# Patient Record
Sex: Female | Born: 1965 | Race: White | Marital: Single | Smoking: Current every day smoker
Health system: Southern US, Community
[De-identification: ages and names within clinical notes are randomized; demographics above are authoritative.]

## PROBLEM LIST (undated history)

## (undated) DIAGNOSIS — I10 Essential (primary) hypertension: Secondary | ICD-10-CM

## (undated) DIAGNOSIS — E785 Hyperlipidemia, unspecified: Secondary | ICD-10-CM

## (undated) DIAGNOSIS — K219 Gastro-esophageal reflux disease without esophagitis: Secondary | ICD-10-CM

## (undated) DIAGNOSIS — M1990 Unspecified osteoarthritis, unspecified site: Secondary | ICD-10-CM

## (undated) HISTORY — DX: Gastro-esophageal reflux disease without esophagitis: K21.9

## (undated) HISTORY — DX: Essential (primary) hypertension: I10

## (undated) HISTORY — DX: Hyperlipidemia, unspecified: E78.5

## (undated) HISTORY — PX: OTHER SURGICAL HISTORY: SHX169

## (undated) HISTORY — DX: Unspecified osteoarthritis, unspecified site: M19.90

---

## 2006-05-27 HISTORY — PX: ABDOMINAL HYSTERECTOMY: SHX81

## 2018-05-29 DIAGNOSIS — I10 Essential (primary) hypertension: Secondary | ICD-10-CM | POA: Insufficient documentation

## 2019-07-09 ENCOUNTER — Other Ambulatory Visit: Payer: Self-pay | Admitting: Medical

## 2019-07-09 ENCOUNTER — Other Ambulatory Visit: Payer: Self-pay | Admitting: Family Medicine

## 2019-07-09 DIAGNOSIS — M48061 Spinal stenosis, lumbar region without neurogenic claudication: Secondary | ICD-10-CM

## 2019-07-21 ENCOUNTER — Ambulatory Visit
Admission: RE | Admit: 2019-07-21 | Discharge: 2019-07-21 | Disposition: A | Discharge status: Home / Self Care | Payer: Managed Care, Other (non HMO) | Source: Ambulatory Visit | Attending: Medical | Admitting: Medical

## 2019-07-21 ENCOUNTER — Other Ambulatory Visit: Payer: Self-pay

## 2019-07-21 DIAGNOSIS — M48061 Spinal stenosis, lumbar region without neurogenic claudication: Secondary | ICD-10-CM | POA: Insufficient documentation

## 2019-08-16 ENCOUNTER — Telehealth: Payer: Self-pay

## 2019-08-16 NOTE — Progress Notes (Signed)
Patient: Tonya Robertson  Service Category: E/M  Provider: Gaspar Cola, MD  DOB: 04/27/1966  DOS: 08/17/2019  Referring Provider: Katha Cabal, MD  MRN: [id number]  Setting: Ambulatory outpatient  PCP: Pearletha Alfred, PA  Type: New Patient  Specialty: Interventional Pain Management    Location: Office  Delivery: Face-to-face     Primary Reason(s) for Visit: Encounter for initial evaluation of one or more chronic problems (new to examiner) potentially causing chronic pain, and posing a threat to normal musculoskeletal function. (Level of risk: High) CC: Back Pain (lumbar bilateral )  HPI  Tonya Robertson is a 53 y.o. year old, female patient, who comes today to see [place] for the first time for an initial evaluation of her chronic pain. She has Chronic pain syndrome; Chronic musculoskeletal pain; Spasm of muscle of lower back; Chronic low back pain (1ry area of Pain) (Bilateral) (R>L) w/o sciatica; Inflammatory spondylopathy of lumbosacral region [facility]); Neurogenic pain; Pharmacologic therapy; Disorder of skeletal system; Problems influencing health status; Grade 1 Anterolisthesis of L4 over L5; DDD (degenerative disc disease), lumbosacral; Lumbar facet syndrome (Bilateral) (R>L); Chronic sacroiliac joint pain (Left); Abnormal MRI, lumbar spine (07/22/2019); Lumbar central spinal stenosis, w/o neurogenic claudication (Severe at L3-4, L4-5); Lumbosacral foraminal stenosis (L1-S1) (Bilateral) (Severe on left: L1-2, L2-3, L3-4) (Severe on right: L3-4, L4-5); Lumbosacral facet arthropathy (Multilevel) (Bilateral); and 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 Today she comes in for evaluation of her Back Pain (lumbar bilateral )  Pain Assessment: Location: Left, Right, Lower Back Radiating: into buttocks Onset: More than a month ago Duration: Chronic pain Quality: Discomfort, Constant, Aching, Sharp, Stabbing, Other (Comment), Tightness(brutal, feels like  something is twisted in the back which is a new s/s) Severity: 3 /10 (subjective, self-reported pain score)  Note: Reported level is compatible with observation.                         When using our objective Pain Scale, levels between 6 and 10/10 are said to belong in an emergency room, as it progressively worsens from a 6/10, described as severely limiting, requiring emergency care not usually available at an outpatient pain management facility. At a 6/10 level, communication becomes difficult and requires great effort. Assistance to reach the emergency department may be required. Facial flushing and profuse sweating along with potentially dangerous increases in heart rate and blood pressure will be evident. Effect on ADL: standing too long, walking or prolonged sitting can increase the pain. Timing: Constant Modifying factors: ice BP: 103/76  HR: 91  Onset and Duration: Gradual and Date of onset: 2003 Cause of pain: Unknown Severity: Getting worse, NAS-11 at its worse: 10/10, NAS-11 at its best: 0/10, NAS-11 now: 0/10 and NAS-11 on the average: 4/10 Timing: Evening and After activity or exercise Aggravating Factors: Prolonged standing Alleviating Factors: Bending, Cold packs, Lying down, Medications, Resting, Sleeping and Chiropractic manipulations Associated Problems: Depression and Pain that does not allow patient to sleep Quality of Pain: Aching, Intermittent, Feeling of weight, Getting longer, Heavy, Nagging, Pressure-like, Pulsating, Sharp, Shooting, Stabbing, Tender, Throbbing and Toothache-like Previous Examinations or Tests: Epidurogram, MRI scan, Nerve block, X-rays, Neurosurgical evaluation and Chiropractic evaluation Previous Treatments: Chiropractic manipulations, Epidural steroid injections, Facet blocks, Narcotic medications, Physical Therapy, Strengthening exercises, Stretching exercises and TENS  The patient comes into the clinics today for the first time for a chronic pain  management evaluation.  According to the patient her worst and only pain is  that of the lower back and buttocks, bilaterally, with the right being worse than the left.  She denies any prior surgeries but does admit to having had physical therapy more than 5 years ago.  She also indicates having had lumbar epidural steroid injections, facet blocks, and SI joint injections.  All of these were done at the [facility] pain management clinic in [place].  The patient indicates that the SI joint injections helped the most, followed by the lumbar epidural steroid injections, followed by the lumbar facet blocks.  The patient also indicates that when she left the area approximately a year and a half ago, she had been told that if the pain persisted, that they were going to consider doing, "burning of the nerve".  The patient indicates having recently had a lumbar MRI on July 01, 2019.  The results of the lumbar MRI can be seen below.  The patient describes the low back pain as then electrical-like, stabbing sensation that started last week.  Prior to that he used to be simply a pressure like, constant pain.  She cannot remember having done anything that would have triggered this pain.  She indicates that she simply woke up this past Sunday morning with the pain that started on the right side and yesterday it started moving onto the left side.  She denies any lower extremity pain, numbness, or weakness.  In fact, she refers that she has never experienced any of those.  Physical exam was significant today for reproduction of her pain upon attempting hyperextension of the lumbar spine, which she was unable to complete due to the sharp pain.  Provocative maneuvers such as hyperextension and rotation of the lumbar spine confirmed that the patient does have a bilateral lumbar facet component to her pain.  Straight leg raise was significant on the left side for a sharp pain in the lower back which she described as  being in the midline.  No lower extremity radiation of the pain.  Once the sharp pain went away, she was able to raise the leg to close to 90 degrees, bilaterally.  Patrick maneuver was positive on the left side for pain arising from the SI joint.  The rest of the exam was noncontributory.  Historic Controlled Substance Pharmacotherapy Review  PMP and historical list of controlled substances: Hydrocodone/APAP 5/325; alprazolam 0.5 mg; alprazolam 0.25 mg; phentermine 37.5 mg; Gralise ER 600 mg. Highest opioid analgesic regimen found: Hydrocodone/APAP 5/325 1 tab PO 5/day Most recent opioid analgesic: Hydrocodone/APAP 5/325 1 tab PO 5/day  Current opioid analgesics:  None Highest recorded MME/day: 25 mg/day MME/day: 0 mg/day  Medications: The patient did not bring the medication(s) to the appointment, as requested in our "New Patient Package" Pharmacodynamics: Desired effects: Analgesia: The patient reports >50% benefit. Reported improvement in function: The patient reports medication allows her to accomplish basic ADLs. Clinically meaningful improvement in function (CMIF): Sustained CMIF goals met Perceived effectiveness: Described as relatively effective, allowing for increase in activities of daily living (ADL) Undesirable effects: Side-effects or Adverse reactions: None reported Historical Monitoring: The patient  reports no history of drug use. List of all UDS Test(s): No results found. List of other Serum/Urine Drug Screening Test(s):  No results found. Historical Background Evaluation: [place] PMP: PDMP reviewed during this encounter. Six (6) year initial data search conducted.             PMP NARX Score Report:  Narcotic: 070 Sedative: 060 Stimulant: 000 [place] Department of public safety, offender  search: Editor, commissioning Information) Non-contributory Risk Assessment Profile: Aberrant behavior: None observed or detected today Risk factors for fatal opioid overdose: None identified today PMP  NARX Overdose Risk Score: 290 Fatal overdose hazard ratio (HR): Calculation deferred Non-fatal overdose hazard ratio (HR): Calculation deferred Risk of opioid abuse or dependence: 0.7-3.0% with doses ? 36 MME/day and 6.1-26% with doses ? 120 MME/day. Substance use disorder (SUD) risk level: See below Personal History of Substance Abuse (SUD-Substance use disorder):  Alcohol: Negative  Illegal Drugs: Negative  Rx Drugs: Negative  ORT Risk Level calculation: Low Risk Opioid Risk Tool - 08/17/19 1131      Family History of Substance Abuse   Alcohol  Negative    Illegal Drugs  Negative    Rx Drugs  Negative      Personal History of Substance Abuse   Alcohol  Negative    Illegal Drugs  Negative    Rx Drugs  Negative      Age   Age between 9-45 years   No      Psychological Disease   Psychological Disease  Positive    ADD  Negative    OCD  Negative    Bipolar  Negative    Schizophrenia  Negative    Depression  Positive      Total Score   Opioid Risk Tool Scoring  3    Opioid Risk Interpretation  Low Risk      ORT Scoring interpretation table:  Score <3 = Low Risk for SUD  Score between 4-7 = Moderate Risk for SUD  Score >8 = High Risk for Opioid Abuse   PHQ-2 Depression Scale:  Total score:    PHQ-2 Scoring interpretation table: (Score and probability of major depressive disorder)  Score 0 = No depression  Score 1 = 15.4% Probability  Score 2 = 21.1% Probability  Score 3 = 38.4% Probability  Score 4 = 45.5% Probability  Score 5 = 56.4% Probability  Score 6 = 78.6% Probability   PHQ-9 Depression Scale:  Total score:    PHQ-9 Scoring interpretation table:  Score 0-4 = No depression  Score 5-9 = Mild depression  Score 10-14 = Moderate depression  Score 15-19 = Moderately severe depression  Score 20-27 = Severe depression (2.4 times higher risk of SUD and 2.89 times higher risk of overuse)   Pharmacologic Plan: As per protocol, I have not taken over any  controlled substance management, pending the results of ordered tests and/or consults.            Initial impression: Pending review of available data and ordered tests.  Meds   Current Outpatient Medications:  .  acetaminophen (TYLENOL) 500 MG tablet, Take 500 mg by mouth every 6 (six) hours as needed., Disp: , Rfl:  .  buPROPion (WELLBUTRIN XL) 300 MG 24 hr tablet, Take 300 mg by mouth daily., Disp: , Rfl:  .  cyclobenzaprine (FLEXERIL) 10 MG tablet, Take 10 mg by mouth 3 (three) times daily as needed for muscle spasms., Disp: , Rfl:  .  DULoxetine (CYMBALTA) 60 MG capsule, Take 60 mg by mouth 2 (two) times daily., Disp: , Rfl:  .  gabapentin (NEURONTIN) 300 MG capsule, Take 3 capsules (900 mg total) by mouth at bedtime., Disp: 90 capsule, Rfl: 0 .  ibuprofen (ADVIL) 800 MG tablet, Take 800 mg by mouth every 8 (eight) hours as needed., Disp: , Rfl:  .  Olmesartan-amLODIPine-HCTZ 40-10-25 MG TABS, Take 1 tablet by mouth daily., Disp: ,  Rfl:  .  predniSONE (DELTASONE) 20 MG tablet, Take 3 tablets (60 mg total) by mouth daily with breakfast for 3 days, THEN 2 tablets (40 mg total) daily with breakfast for 3 days, THEN 1 tablet (20 mg total) daily with breakfast for 3 days., Disp: 18 tablet, Rfl: 0  Imaging Review  Lumbosacral Imaging: Lumbar MR wo contrast:  Results for orders placed during the hospital encounter of 07/21/19  MR LUMBAR SPINE WO CONTRAST   Narrative CLINICAL DATA:  Low back pain.  EXAM: MRI LUMBAR SPINE WITHOUT CONTRAST  TECHNIQUE: Multiplanar, multisequence MR imaging of the lumbar spine was performed. No intravenous contrast was administered.  COMPARISON:  None.  FINDINGS: Segmentation:  Standard.  Alignment: Right convex scoliosis. Grade 1 anterolisthesis of L4 over L5 related to facet degeneration.  Vertebrae: Degenerative endplate changes noted at L4-5. Marrow edema in the facet joints are noted at this level, more pronounced on the right side.  Conus  medullaris and cauda equina: Conus extends to the L1-2 level. Conus and cauda equina appear normal.  Paraspinal and other soft tissues: Negative.  Disc levels:  T11-T12: Posterior disc protrusion causing indentation on the thecal sac. No significant spinal canal or neural foraminal stenosis.  T12-L1: Small posterior disc protrusion causing minimal indentation thecal sac. No spinal canal or neural foraminal stenosis.  L1-2: Small disc bulge with superimposed far lateral disc protrusion and facet degenerative changes resulting in moderate right and severe left neural foraminal narrowing. No significant spinal canal stenosis.  L2-3: Disc bulge, facet degenerative changes and prominence of the posterior epidural fat resulting in mild narrowing of the thecal sac, mild to moderate right and severe left neural foraminal narrowing.  L3-4: Disc bulge, moderate to advanced facet degenerative changes, ligamentum flavum redundancy and prominence of the epidural fat resulting severe narrowing of the thecal sac with moderate to severe spinal canal stenosis and severe bilateral neural foraminal narrowing.  L4-5: Prominent loss of disc height, disc bulge/uncovering of the disc, prominent facet degenerative changes and ligamentum flavum redundancy resulting in severe spinal canal stenosis, severe right and moderate left neural foraminal narrowing.  L5-S1: Shallow disc bulge and moderate facet degenerative changes, right greater than left, resulting in moderate bilateral neural foraminal narrowing.  IMPRESSION: 1. Multilevel degenerative changes of the lumbar spine as described above, with severe spinal canal stenosis at L3-L4 and L4-L5 and moderate to severe spinal canal stenosis at L3-L4. 2. Multilevel high-grade neural foraminal narrowing at L2-L3, L3-L4, and L4-L5. 3. Marrow edema in the facet joints at L4-L5, likely degenerative.   Electronically Signed   By: Pedro Earls M.D.   On: 07/22/2019 10:52    Complexity Note: Imaging results reviewed. Results shared with Tonya Robertson, using Layman's terms.                        ROS  Cardiovascular: No reported cardiovascular signs or symptoms such as High blood pressure, coronary artery disease, abnormal heart rate or rhythm, heart attack, blood thinner therapy or heart weakness and/or failure Pulmonary or Respiratory: Smoking Neurological: No reported neurological signs or symptoms such as seizures, abnormal skin sensations, urinary and/or fecal incontinence, being born with an abnormal open spine and/or a tethered spinal cord Psychological-Psychiatric: Depressed Gastrointestinal: Reflux or heatburn and Irregular, infrequent bowel movements (Constipation) Genitourinary: No reported renal or genitourinary signs or symptoms such as difficulty voiding or producing urine, peeing blood, non-functioning kidney, kidney stones, difficulty emptying the bladder, difficulty controlling  the flow of urine, or chronic kidney disease Hematological: No reported hematological signs or symptoms such as prolonged bleeding, low or poor functioning platelets, bruising or bleeding easily, hereditary bleeding problems, low energy levels due to low hemoglobin or being anemic Endocrine: No reported endocrine signs or symptoms such as high or low blood sugar, rapid heart rate due to high thyroid levels, obesity or weight gain due to slow thyroid or thyroid disease Rheumatologic: No reported rheumatological signs and symptoms such as fatigue, joint pain, tenderness, swelling, redness, heat, stiffness, decreased range of motion, with or without associated rash Musculoskeletal: Negative for myasthenia gravis, muscular dystrophy, multiple sclerosis or malignant hyperthermia Work History: Working full time  Allergies  Tonya Robertson has No Known Allergies.  Laboratory Chemistry Profile   Renal No results found for: BUN, CREATININE, LABCREA,  BCR, GFR, GFRAA, GFRNONAA, SPECGRAV, PHUR, PROTEINUR  Electrolytes No results found for: NA, K, CL, CALCIUM, MG, PHOS  Hepatic No results found for: AST, ALT, ALBUMIN, ALKPHOS, AMYLASE, LIPASE, AMMONIA  ID No results found for: LYMEIGGIGMAB, HIV, SARSCOV2NAA, STAPHAUREUS, MRSAPCR, HCVAB, PREGTESTUR, RMSFIGG, QFVRPH1IGG, QFVRPH2IGG, LYMEIGGIGMAB  Bone No results found for: VD25OH, [id number], DI2641RA3, [id number], 25OHVITD1, 25OHVITD2, 25OHVITD3, TESTOFREE, TESTOSTERONE  Endocrine No results found for: GLUCOSE, GLUCOSEU, HGBA1C, TSH, FREET4, TESTOFREE, TESTOSTERONE, SHBG, ESTRADIOL, ESTRADIOLPCT, ESTRADIOLFRE, LABPREG, ACTH, CRTSLPL, UCORFRPERLTR, UCORFRPERDAY, CORTISOLBASE, LABPREG  Neuropathy No results found for: VITAMINB12, FOLATE, HGBA1C, HIV  CNS No results found for: COLORCSF, APPEARCSF, RBCCOUNTCSF, WBCCSF, POLYSCSF, LYMPHSCSF, EOSCSF, PROTEINCSF, GLUCCSF, JCVIRUS, CSFOLI, IGGCSF, LABACHR, ACETBL, LABACHR, ACETBL  Inflammation (CRP: Acute  ESR: Chronic) No results found for: CRP, ESRSEDRATE, LATICACIDVEN  Rheumatology No results found for: RF, ANA, LABURIC, URICUR, LYMEIGGIGMAB, LYMEABIGMQN, HLAB27  Coagulation No results found for: INR, LABPROT, APTT, PLT, DDIMER, LABHEMA, VITAMINK1, AT3  Cardiovascular No results found for: BNP, CKTOTAL, CKMB, TROPONINI, HGB, HCT, LABVMA, EPIRU, EPINEPH24HUR, NOREPRU, NOREPI24HUR, DOPARU, DOPAM24HRUR  Screening No results found for: SARSCOV2NAA, COVIDSOURCE, STAPHAUREUS, MRSAPCR, HCVAB, HIV, PREGTESTUR  Cancer No results found for: CEA, CA125, LABCA2  Allergens No results found for: ALMOND, APPLE, ASPARAGUS, AVOCADO, BANANA, BARLEY, BASIL, BAYLEAF, GREENBEAN, LIMABEAN, WHITEBEAN, BEEFIGE, REDBEET, BLUEBERRY, BROCCOLI, CABBAGE, MELON, CARROT, CASEIN, CASHEWNUT, CAULIFLOWER, CELERY    Note: Lab results reviewed.   PFSH  Drug: Tonya Robertson  reports no history of drug use. Alcohol:  reports current alcohol use. Tobacco:  reports that she has been  smoking. She has never used smokeless tobacco. Medical:  has no past medical history on file. Family: family history is not on file.  Active Ambulatory Problems    Diagnosis Date Noted  . Chronic pain syndrome 08/17/2019  . Chronic musculoskeletal pain 08/17/2019  . Spasm of muscle of lower back 08/17/2019  . Chronic low back pain (1ry area of Pain) (Bilateral) (R>L) w/o sciatica 08/17/2019  . Inflammatory spondylopathy of lumbosacral region ([place]) 08/17/2019  . Neurogenic pain 08/17/2019  . Pharmacologic therapy 08/17/2019  . Disorder of skeletal system 08/17/2019  . Problems influencing health status 08/17/2019  . Grade 1 Anterolisthesis of L4 over L5 08/17/2019  . DDD (degenerative disc disease), lumbosacral 08/17/2019  . Lumbar facet syndrome (Bilateral) (R>L) 08/17/2019  . Chronic sacroiliac joint pain (Left) 08/17/2019  . Abnormal MRI, lumbar spine (07/22/2019) 08/17/2019  . Lumbar central spinal stenosis, w/o neurogenic claudication (Severe at L3-4, L4-5) 08/17/2019  . Lumbosacral foraminal stenosis (L1-S1) (Bilateral) (Severe on left: L1-2, L2-3, L3-4) (Severe on right: L3-4, L4-5) 08/17/2019  . Lumbosacral facet arthropathy (Multilevel) (Bilateral) 08/17/2019  . 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 No Resolved Ambulatory Problems   No Additional Past Medical History   Constitutional Exam  General appearance: Well nourished, well developed, and well hydrated. In no apparent acute distress Vitals:   08/17/19 1128  BP: 103/76  Pulse: 91  Resp: 16  Temp: 98 F (36.7 C)  TempSrc: Temporal  SpO2: 99%  Weight: 198 lb (89.8 kg)  Height: '5\' 4"'  (1.626 m)   BMI Assessment: Estimated body mass index is 33.99 kg/m as calculated from the following:   Height as of this encounter: '5\' 4"'  (1.626 m).   Weight as of this encounter: 198 lb (89.8 kg).  BMI interpretation table: BMI  level Category Range association with higher incidence of chronic pain  <18 kg/m2 Underweight   18.5-24.9 kg/m2 Ideal body weight   25-29.9 kg/m2 Overweight Increased incidence by 20%  30-34.9 kg/m2 Obese (Class I) Increased incidence by 68%  35-39.9 kg/m2 Severe obesity (Class II) Increased incidence by 136%  >40 kg/m2 Extreme obesity (Class III) Increased incidence by 254%   Patient's current BMI Ideal Body weight  Body mass index is 33.99 kg/m. Ideal body weight: 54.7 kg (120 lb 9.5 oz) Adjusted ideal body weight: 68.7 kg (151 lb 8.9 oz)   BMI Readings from Last 4 Encounters:  08/17/19 33.99 kg/m   Wt Readings from Last 4 Encounters:  08/17/19 198 lb (89.8 kg)   Psych/Mental status: Alert, oriented x 3 (person, place, & time)       Eyes: PERLA Respiratory: No evidence of acute respiratory distress  Cervical Spine Exam  Skin & Axial Inspection: No masses, redness, edema, swelling, or associated skin lesions Alignment: Symmetrical Functional ROM: Unrestricted ROM      Stability: No instability detected Muscle Tone/Strength: Functionally intact. No obvious neuro-muscular anomalies detected. Sensory (Neurological): Unimpaired Palpation: No palpable anomalies              Upper Extremity (UE) Exam    Side: Right upper extremity  Side: Left upper extremity  Skin & Extremity Inspection: Skin color, temperature, and hair growth are WNL. No peripheral edema or cyanosis. No masses, redness, swelling, asymmetry, or associated skin lesions. No contractures.  Skin & Extremity Inspection: Skin color, temperature, and hair growth are WNL. No peripheral edema or cyanosis. No masses, redness, swelling, asymmetry, or associated skin lesions. No contractures.  Functional ROM: Unrestricted ROM          Functional ROM: Unrestricted ROM          Muscle Tone/Strength: Functionally intact. No obvious neuro-muscular anomalies detected.  Muscle Tone/Strength: Functionally intact. No obvious  neuro-muscular anomalies detected.  Sensory (Neurological): Unimpaired          Sensory (Neurological): Unimpaired          Palpation: No palpable anomalies              Palpation: No palpable anomalies              Provocative Test(s):  Phalen's test: deferred Tinel's test: deferred Apley's scratch test (touch opposite shoulder):  Action 1 (Across chest): deferred Action 2 (Overhead): deferred Action 3 (LB reach): deferred   Provocative Test(s):  Phalen's test: deferred Tinel's test: deferred Apley's scratch test (touch opposite shoulder):  Action 1 (Across chest): deferred Action 2 (Overhead): deferred Action 3 (LB reach): deferred    Thoracic Spine Area Exam  Skin & Axial Inspection: No masses, redness, or swelling Alignment: Symmetrical Functional ROM: Unrestricted ROM Stability:  No instability detected Muscle Tone/Strength: Functionally intact. No obvious neuro-muscular anomalies detected. Sensory (Neurological): Unimpaired Muscle strength & Tone: No palpable anomalies  Lumbar Exam  Skin & Axial Inspection: No masses, redness, or swelling Alignment: Scoliosis detected Functional ROM: Limited ROM affecting both sides Stability: Difficult to determine secondary to her guarding. Muscle Tone/Strength: Functionally intact. No obvious neuro-muscular anomalies detected. Sensory (Neurological): Movement-associated pain Palpation: Tender to palpation       Provocative Tests: Hyperextension/rotation test: (+) bilaterally for facet joint pain. Lumbar quadrant test (Kemp's test): deferred today       Lateral bending test: deferred today       Patrick's Maneuver: (+) for left-sided S-I arthralgia             FABER* test: (+) for left-sided S-I arthralgia             S-I anterior distraction/compression test: deferred today         S-I lateral compression test: deferred today         S-I Thigh-thrust test: deferred today         S-I Gaenslen's test: deferred today          *(Flexion, ABduction and External Rotation)  Gait & Posture Assessment  Ambulation: Unassisted Gait: Antalgic Posture: Antalgic   Lower Extremity Exam    Side: Right lower extremity  Side: Left lower extremity  Stability: No instability observed          Stability: No instability observed          Skin & Extremity Inspection: Skin color, temperature, and hair growth are WNL. No peripheral edema or cyanosis. No masses, redness, swelling, asymmetry, or associated skin lesions. No contractures.  Skin & Extremity Inspection: Skin color, temperature, and hair growth are WNL. No peripheral edema or cyanosis. No masses, redness, swelling, asymmetry, or associated skin lesions. No contractures.  Functional ROM: Unrestricted ROM                  Functional ROM: Unrestricted ROM                  Muscle Tone/Strength: Able to Toe-walk & Heel-walk without problems  Muscle Tone/Strength: Able to Toe-walk & Heel-walk without problems  Sensory (Neurological): Unimpaired        Sensory (Neurological): Unimpaired        DTR: Patellar: deferred today Achilles: deferred today Plantar: deferred today  DTR: Patellar: deferred today Achilles: deferred today Plantar: deferred today  Palpation: No palpable anomalies  Palpation: No palpable anomalies   Assessment  Primary Diagnosis & Pertinent Problem List: The primary encounter diagnosis was Chronic low back pain (1ry area of Pain) (Bilateral) (R>L) w/o sciatica. Diagnoses of Spasm of muscle of lower back, Inflammatory spondylopathy of lumbosacral region [facility]), Grade 1 Anterolisthesis of L4 over L5, DDD (degenerative disc disease), lumbosacral, Lumbar facet syndrome (Bilateral) (R>L), Lumbosacral facet arthropathy (Multilevel) (Bilateral), Abnormal MRI, lumbar spine (07/22/2019), Lumbar central spinal stenosis, w/o neurogenic claudication (Severe at L3-4, L4-5), Lumbosacral foraminal stenosis (L1-S1) (Bilateral) (Severe on left: L1-2, L2-3, L3-4) (Severe on  right: L3-4, L4-5), 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  Visit Diagnosis (New problems to examiner): 1. Chronic low back pain (1ry area of Pain) (Bilateral) (R>L) w/o sciatica   2. Spasm of muscle of lower back   3. Inflammatory  spondylopathy of lumbosacral region ([place])   4. Grade 1 Anterolisthesis of L4 over L5   5. DDD (degenerative disc disease), lumbosacral   6. Lumbar facet syndrome (Bilateral) (R>L)   7. Lumbosacral facet arthropathy (Multilevel) (Bilateral)   8. Abnormal MRI, lumbar spine (07/22/2019)   9. Lumbar central spinal stenosis, w/o neurogenic claudication (Severe at L3-4, L4-5)   10. Lumbosacral foraminal stenosis (L1-S1) (Bilateral) (Severe on left: L1-2, L2-3, L3-4) (Severe on right: L3-4, L4-5)   11. 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 Chronic sacroiliac joint pain (Left)   13. Chronic pain syndrome   14. Chronic musculoskeletal pain   15. Neurogenic pain   16. Pharmacologic therapy   17. Disorder of skeletal system   18. Problems influencing health status    Plan of Care (Initial workup plan)  Note: Tonya Robertson was reminded that as per protocol, today's visit has been an evaluation only. We have not taken over the patient's controlled substance management.  Problem-specific plan: No problem-specific Assessment & Plan notes found for this encounter.   Lab Orders     Comp. Metabolic Panel (12)     Magnesium     Vitamin B12     Sedimentation rate     25-Hydroxy vitamin D Lcms D2+D3     C-reactive protein  Imaging Orders     DG Lumbar Spine Complete W/Bend     DG Si Joints Referral Orders  No referral(s) requested today    Procedure Orders     LUMBAR FACET(MEDIAL  BRANCH NERVE BLOCK) MBNB Pharmacotherapy (current): Medications ordered:  Meds ordered this encounter  Medications  . predniSONE (DELTASONE) 20 MG tablet    Sig: Take 3 tablets (60 mg total) by mouth daily with breakfast for 3 days, THEN 2 tablets (40 mg total) daily with breakfast for 3 days, THEN 1 tablet (20 mg total) daily with breakfast for 3 days.    Dispense:  18 tablet    Refill:  0  . ketorolac (TORADOL) injection 60 mg  . orphenadrine (NORFLEX) injection 60 mg  . gabapentin (NEURONTIN) 300 MG capsule    Sig: Take 3 capsules (900 mg total) by mouth at bedtime.    Dispense:  90 capsule    Refill:  0    Fill one day early if pharmacy is closed on scheduled refill date. May substitute for generic, or similar, if available.   Medications administered during this visit: We administered ketorolac and orphenadrine.   Pharmacological management options:  Opioid Analgesics: The patient was informed that there is no guarantee that she would be a candidate for opioid analgesics. The decision will be made following CDC guidelines. This decision will be based on the results of diagnostic studies, as well as Tonya Robertson's risk profile.   Membrane stabilizer: To be determined at a later time  Muscle relaxant: To be determined at a later time  NSAID: To be determined at a later time  Other analgesic(s): To be determined at a later time   Interventional management options: Tonya Robertson was informed that there is no guarantee that she would be a candidate for interventional therapies. The decision will be based on the results of diagnostic studies, as well as Tonya Robertson's risk profile.  Procedure(s) under consideration:  Diagnostic bilateral lumbar facet block #1  Therapeutic bilateral intra-articular L4-5 facet injection  Possible bilateral lumbar facet RFA  Diagnostic left SI joint block  Possible left SI joint RFA  Diagnostic right-sided L2-3 LESI  Diagnostic  right-sided L3-4 LESI  Diagnostic  right-sided L4-5 LESI  Diagnostic bilateral L1 TFESI  Diagnostic bilateral L2 TFESI  Diagnostic bilateral L3 TFESI  Diagnostic bilateral L4 TFESI  Diagnostic bilateral L5 TFESI    Provider-requested follow-up: Return for Procedure (w/ sedation): (B) L-FCT BLK #1.  No future appointments.  Note by: Gaspar Cola, MD Date: 08/17/2019; Time: 3:44 PM

## 2019-08-16 NOTE — Telephone Encounter (Signed)
LM for patient to call office to go over new patient questions.

## 2019-08-17 ENCOUNTER — Other Ambulatory Visit: Payer: Self-pay

## 2019-08-17 ENCOUNTER — Ambulatory Visit: Payer: Managed Care, Other (non HMO) | Admitting: Pain Medicine

## 2019-08-17 ENCOUNTER — Encounter: Payer: Self-pay | Admitting: Pain Medicine

## 2019-08-17 ENCOUNTER — Ambulatory Visit
Admission: RE | Admit: 2019-08-17 | Discharge: 2019-08-17 | Disposition: A | Discharge status: Home / Self Care | Payer: Managed Care, Other (non HMO) | Source: Ambulatory Visit | Attending: Pain Medicine | Admitting: Pain Medicine

## 2019-08-17 VITALS — BP 103/76 | HR 91 | Temp 98.0°F | Resp 16 | Ht 64.0 in | Wt 198.0 lb

## 2019-08-17 DIAGNOSIS — M7918 Myalgia, other site: Secondary | ICD-10-CM

## 2019-08-17 DIAGNOSIS — Z79899 Other long term (current) drug therapy: Secondary | ICD-10-CM

## 2019-08-17 DIAGNOSIS — M4310 Spondylolisthesis, site unspecified: Secondary | ICD-10-CM

## 2019-08-17 DIAGNOSIS — M533 Sacrococcygeal disorders, not elsewhere classified: Secondary | ICD-10-CM

## 2019-08-17 DIAGNOSIS — M5127 Other intervertebral disc displacement, lumbosacral region: Secondary | ICD-10-CM | POA: Insufficient documentation

## 2019-08-17 DIAGNOSIS — G894 Chronic pain syndrome: Secondary | ICD-10-CM

## 2019-08-17 DIAGNOSIS — M4697 Unspecified inflammatory spondylopathy, lumbosacral region: Secondary | ICD-10-CM | POA: Insufficient documentation

## 2019-08-17 DIAGNOSIS — Z789 Other specified health status: Secondary | ICD-10-CM

## 2019-08-17 DIAGNOSIS — M48061 Spinal stenosis, lumbar region without neurogenic claudication: Secondary | ICD-10-CM | POA: Insufficient documentation

## 2019-08-17 DIAGNOSIS — M792 Neuralgia and neuritis, unspecified: Secondary | ICD-10-CM | POA: Insufficient documentation

## 2019-08-17 DIAGNOSIS — M5137 Other intervertebral disc degeneration, lumbosacral region: Secondary | ICD-10-CM | POA: Insufficient documentation

## 2019-08-17 DIAGNOSIS — M47817 Spondylosis without myelopathy or radiculopathy, lumbosacral region: Secondary | ICD-10-CM

## 2019-08-17 DIAGNOSIS — R937 Abnormal findings on diagnostic imaging of other parts of musculoskeletal system: Secondary | ICD-10-CM | POA: Insufficient documentation

## 2019-08-17 DIAGNOSIS — M899 Disorder of bone, unspecified: Secondary | ICD-10-CM | POA: Insufficient documentation

## 2019-08-17 DIAGNOSIS — G8929 Other chronic pain: Secondary | ICD-10-CM | POA: Insufficient documentation

## 2019-08-17 DIAGNOSIS — M62830 Muscle spasm of back: Secondary | ICD-10-CM | POA: Diagnosis not present

## 2019-08-17 DIAGNOSIS — M545 Low back pain, unspecified: Secondary | ICD-10-CM

## 2019-08-17 DIAGNOSIS — M47816 Spondylosis without myelopathy or radiculopathy, lumbar region: Secondary | ICD-10-CM | POA: Insufficient documentation

## 2019-08-17 DIAGNOSIS — M4807 Spinal stenosis, lumbosacral region: Secondary | ICD-10-CM

## 2019-08-17 DIAGNOSIS — M51379 Other intervertebral disc degeneration, lumbosacral region without mention of lumbar back pain or lower extremity pain: Secondary | ICD-10-CM

## 2019-08-17 MED ORDER — GABAPENTIN 300 MG PO CAPS: 900.0000 mg | ORAL_CAPSULE | Freq: Every day | ORAL | 0 refills | Status: DC

## 2019-08-17 MED ORDER — ORPHENADRINE CITRATE 30 MG/ML IJ SOLN
INTRAMUSCULAR | Status: AC
  Filled 2019-08-17: qty 2

## 2019-08-17 MED ORDER — PREDNISONE 20 MG PO TABS: ORAL_TABLET | ORAL | 0 refills | Status: AC

## 2019-08-17 MED ORDER — ORPHENADRINE CITRATE 30 MG/ML IJ SOLN
60.0000 mg | Freq: Once | INTRAMUSCULAR | Status: AC
  Administered 2019-08-17: 13:00:00 60 mg via INTRAMUSCULAR

## 2019-08-17 MED ORDER — KETOROLAC TROMETHAMINE 60 MG/2ML IM SOLN
INTRAMUSCULAR | Status: AC
  Filled 2019-08-17: qty 2

## 2019-08-17 MED ORDER — KETOROLAC TROMETHAMINE 60 MG/2ML IM SOLN
60.0000 mg | Freq: Once | INTRAMUSCULAR | Status: AC
  Administered 2019-08-17: 60 mg via INTRAMUSCULAR

## 2019-08-17 NOTE — Patient Instructions (Signed)
____________________________________________________________________________________________  Muscle Spasms & Cramps  Cause:  The most common cause of muscle spasms and cramps is vitamin and/or electrolyte (calcium, potassium, sodium, etc.) deficiencies.  Possible triggers: Sweating - causes loss of electrolytes thru the skin. Steroids - causes loss of electrolytes thru the urine.  Treatment: 1. Gatorade (or any other electrolyte-replenishing drink) - Take 1, 8 oz glass with each meal (3 times a day). 2. OTC (over-the-counter) Magnesium 400 to 500 mg - Take 1 tablet twice a day (one with breakfast and one before bedtime). If you have kidney problems, talk to your primary care physician before taking any Magnesium. 3. Tonic Water with quinine - Take 1, 8 oz glass before bedtime.   ____________________________________________________________________________________________   ____________________________________________________________________________________________  Preparing for Procedure with Sedation  Procedure appointments are limited to planned procedures: . No Prescription Refills. . No disability issues will be discussed. . No medication changes will be discussed.  Instructions: . Oral Intake: Do not eat or drink anything for at least 3 hours prior to your procedure. (Exception: Blood Pressure Medication. See below.) . Transportation: Unless otherwise stated by your physician, you may drive yourself after the procedure. . Blood Pressure Medicine: Do not forget to take your blood pressure medicine with a sip of water the morning of the procedure. If your Diastolic (lower reading)is above 100 mmHg, elective cases will be cancelled/rescheduled. . Blood thinners: These will need to be stopped for procedures. Notify our staff if you are taking any blood thinners. Depending on which one you take, there will be specific instructions on how and when to stop it. . Diabetics on insulin:  Notify the staff so that you can be scheduled 1st case in the morning. If your diabetes requires high dose insulin, take only  of your normal insulin dose the morning of the procedure and notify the staff that you have done so. . Preventing infections: Shower with an antibacterial soap the morning of your procedure. . Build-up your immune system: Take 1000 mg of Vitamin C with every meal (3 times a day) the day prior to your procedure. Marland Kitchen Antibiotics: Inform the staff if you have a condition or reason that requires you to take antibiotics before dental procedures. . Pregnancy: If you are pregnant, call and cancel the procedure. . Sickness: If you have a cold, fever, or any active infections, call and cancel the procedure. . Arrival: You must be in the facility at least 30 minutes prior to your scheduled procedure. . Children: Do not bring children with you. . Dress appropriately: Bring dark clothing that you would not mind if they get stained. . Valuables: Do not bring any jewelry or valuables.  Reasons to call and reschedule or cancel your procedure: (Following these recommendations will minimize the risk of a serious complication.) . Surgeries: Avoid having procedures within 2 weeks of any surgery. (Avoid for 2 weeks before or after any surgery). . Flu Shots: Avoid having procedures within 2 weeks of a flu shots or . (Avoid for 2 weeks before or after immunizations). . Barium: Avoid having a procedure within 7-10 days after having had a radiological study involving the use of radiological contrast. (Myelograms, Barium swallow or enema study). . Heart attacks: Avoid any elective procedures or surgeries for the initial 6 months after a "Myocardial Infarction" (Heart Attack). . Blood thinners: It is imperative that you stop these medications before procedures. Let us know if you if you take any blood thinner.  . Infection: Avoid procedures during or within  two weeks of an infection (including chest  colds or gastrointestinal problems). Symptoms associated with infections include: Localized redness, fever, chills, night sweats or profuse sweating, burning sensation when voiding, cough, congestion, stuffiness, runny nose, sore throat, diarrhea, nausea, vomiting, cold or Flu symptoms, recent or current infections. It is specially important if the infection is over the area that we intend to treat. Marland Kitchen Heart and lung problems: Symptoms that may suggest an active cardiopulmonary problem include: cough, chest pain, breathing difficulties or shortness of breath, dizziness, ankle swelling, uncontrolled high or unusually low blood pressure, and/or palpitations. If you are experiencing any of these symptoms, cancel your procedure and contact your primary care physician for an evaluation.  Remember:  Regular Business hours are:  Monday to Thursday 8:00 AM to 4:00 PM  Provider's Schedule: Milinda Pointer, MD:  Procedure days: Tuesday and Thursday 7:30 AM to 4:00 PM  Gillis Santa, MD:  Procedure days: Monday and Wednesday 7:30 AM to 4:00 PM ____________________________________________________________________________________________   ____________________________________________________________________________________________  General Risks and Possible Complications  Patient Responsibilities: It is important that you read this as it is part of your informed consent. It is our duty to inform you of the risks and possible complications associated with treatments offered to you. It is your responsibility as a patient to read this and to ask questions about anything that is not clear or that you believe was not covered in this document.  Patient's Rights: You have the right to refuse treatment. You also have the right to change your mind, even after initially having agreed to have the treatment done. However, under this last option, if you wait until the last second to change your mind, you may be charged  for the materials used up to that point.  Introduction: Medicine is not an Chief Strategy Officer. Everything in Medicine, including the lack of treatment(s), carries the potential for danger, harm, or loss (which is by definition: Risk). In Medicine, a complication is a secondary problem, condition, or disease that can aggravate an already existing one. All treatments carry the risk of possible complications. The fact that a side effects or complications occurs, does not imply that the treatment was conducted incorrectly. It must be clearly understood that these can happen even when everything is done following the highest safety standards.  No treatment: You can choose not to proceed with the proposed treatment alternative. The "PRO(s)" would include: avoiding the risk of complications associated with the therapy. The "CON(s)" would include: not getting any of the treatment benefits. These benefits fall under one of three categories: diagnostic; therapeutic; and/or palliative. Diagnostic benefits include: getting information which can ultimately lead to improvement of the disease or symptom(s). Therapeutic benefits are those associated with the successful treatment of the disease. Finally, palliative benefits are those related to the decrease of the primary symptoms, without necessarily curing the condition (example: decreasing the pain from a flare-up of a chronic condition, such as incurable terminal cancer).  General Risks and Complications: These are associated to most interventional treatments. They can occur alone, or in combination. They fall under one of the following six (6) categories: no benefit or worsening of symptoms; bleeding; infection; nerve damage; allergic reactions; and/or death. 1. No benefits or worsening of symptoms: In Medicine there are no guarantees, only probabilities. No healthcare provider can ever guarantee that a medical treatment will work, they can only state the probability that it  may. Furthermore, there is always the possibility that the condition may worsen, either directly, or  indirectly, as a consequence of the treatment. 2. Bleeding: This is more common if the patient is taking a blood thinner, either prescription or over the counter (example: Goody Powders, Fish oil, Aspirin, Garlic, etc.), or if suffering a condition associated with impaired coagulation (example: Hemophilia, cirrhosis of the liver, low platelet counts, etc.). However, even if you do not have one on these, it can still happen. If you have any of these conditions, or take one of these drugs, make sure to notify your treating physician. 3. Infection: This is more common in patients with a compromised immune system, either due to disease (example: diabetes, cancer, human immunodeficiency virus [HIV], etc.), or due to medications or treatments (example: therapies used to treat cancer and rheumatological diseases). However, even if you do not have one on these, it can still happen. If you have any of these conditions, or take one of these drugs, make sure to notify your treating physician. 4. Nerve Damage: This is more common when the treatment is an invasive one, but it can also happen with the use of medications, such as those used in the treatment of cancer. The damage can occur to small secondary nerves, or to large primary ones, such as those in the spinal cord and brain. This damage may be temporary or permanent and it may lead to impairments that can range from temporary numbness to permanent paralysis and/or brain death. 5. Allergic Reactions: Any time a substance or material comes in contact with our body, there is the possibility of an allergic reaction. These can range from a mild skin rash (contact dermatitis) to a severe systemic reaction (anaphylactic reaction), which can result in death. 6. Death: In general, any medical intervention can result in death, most of the time due to an unforeseen  complication. ____________________________________________________________________________________________

## 2019-08-18 ENCOUNTER — Other Ambulatory Visit: Payer: Self-pay | Admitting: Pain Medicine

## 2019-08-18 DIAGNOSIS — E538 Deficiency of other specified B group vitamins: Secondary | ICD-10-CM | POA: Insufficient documentation

## 2019-08-18 MED ORDER — VITAMIN B-12 5000 MCG SL SUBL: 5000.0000 ug | SUBLINGUAL_TABLET | Freq: Every day | SUBLINGUAL | 0 refills | Status: DC

## 2019-08-22 LAB — C-REACTIVE PROTEIN: CRP: 21 mg/L — ABNORMAL HIGH (ref 0–10)

## 2019-08-22 LAB — 25-HYDROXY VITAMIN D LCMS D2+D3
25-Hydroxy, Vitamin D-2: <1 ng/mL
25-Hydroxy, Vitamin D-3: 15 ng/mL
25-Hydroxy, Vitamin D: 16 ng/mL — ABNORMAL LOW

## 2019-08-22 LAB — COMP. METABOLIC PANEL (12)
AST: 15 IU/L (ref 0–40)
Albumin/Globulin Ratio: 1.6 (ref 1.2–2.2)
Albumin: 4.2 g/dL (ref 3.8–4.9)
Alkaline Phosphatase: 110 IU/L (ref 39–117)
BUN/Creatinine Ratio: 20 (ref 9–23)
BUN: 17 mg/dL (ref 6–24)
Bilirubin Total: 0.3 mg/dL (ref 0.0–1.2)
Calcium: 9.5 mg/dL (ref 8.7–10.2)
Chloride: 100 mmol/L (ref 96–106)
Creatinine, Ser: 0.87 mg/dL (ref 0.57–1.00)
GFR calc Af Amer: 89 mL/min/{1.73_m2} (ref >59)
GFR calc non Af Amer: 77 mL/min/{1.73_m2} (ref >59)
Globulin, Total: 2.7 g/dL (ref 1.5–4.5)
Glucose: 86 mg/dL (ref 65–99)
Potassium: 4 mmol/L (ref 3.5–5.2)
Sodium: 139 mmol/L (ref 134–144)
Total Protein: 6.9 g/dL (ref 6.0–8.5)

## 2019-08-22 LAB — SEDIMENTATION RATE: Sed Rate: 43 mm/hr — ABNORMAL HIGH (ref 0–40)

## 2019-08-22 LAB — MAGNESIUM: Magnesium: 1.9 mg/dL (ref 1.6–2.3)

## 2019-08-22 LAB — VITAMIN B12: Vitamin B-12: 138 pg/mL — ABNORMAL LOW (ref 232–1245)

## 2019-08-24 ENCOUNTER — Telehealth: Payer: Self-pay | Admitting: *Deleted

## 2019-09-02 ENCOUNTER — Ambulatory Visit: Payer: Managed Care, Other (non HMO) | Admitting: Pain Medicine

## 2019-09-21 ENCOUNTER — Ambulatory Visit (HOSPITAL_BASED_OUTPATIENT_CLINIC_OR_DEPARTMENT_OTHER): Payer: Managed Care, Other (non HMO) | Admitting: Pain Medicine

## 2019-09-21 ENCOUNTER — Other Ambulatory Visit: Payer: Self-pay

## 2019-09-21 ENCOUNTER — Ambulatory Visit
Admission: RE | Admit: 2019-09-21 | Discharge: 2019-09-21 | Disposition: A | Discharge status: Home / Self Care | Payer: Managed Care, Other (non HMO) | Source: Ambulatory Visit | Attending: Pain Medicine | Admitting: Pain Medicine

## 2019-09-21 ENCOUNTER — Encounter: Payer: Self-pay | Admitting: Pain Medicine

## 2019-09-21 VITALS — BP 110/81 | HR 101 | Temp 98.1°F | Resp 16 | Ht 64.0 in | Wt 200.0 lb

## 2019-09-21 DIAGNOSIS — M4310 Spondylolisthesis, site unspecified: Secondary | ICD-10-CM | POA: Insufficient documentation

## 2019-09-21 DIAGNOSIS — M5137 Other intervertebral disc degeneration, lumbosacral region: Secondary | ICD-10-CM | POA: Diagnosis present

## 2019-09-21 DIAGNOSIS — M545 Low back pain, unspecified: Secondary | ICD-10-CM

## 2019-09-21 DIAGNOSIS — M47817 Spondylosis without myelopathy or radiculopathy, lumbosacral region: Secondary | ICD-10-CM | POA: Diagnosis present

## 2019-09-21 DIAGNOSIS — M4697 Unspecified inflammatory spondylopathy, lumbosacral region: Secondary | ICD-10-CM | POA: Insufficient documentation

## 2019-09-21 DIAGNOSIS — M47816 Spondylosis without myelopathy or radiculopathy, lumbar region: Secondary | ICD-10-CM | POA: Diagnosis not present

## 2019-09-21 DIAGNOSIS — G8929 Other chronic pain: Secondary | ICD-10-CM | POA: Insufficient documentation

## 2019-09-21 MED ORDER — TRIAMCINOLONE ACETONIDE 40 MG/ML IJ SUSP
80.0000 mg | Freq: Once | INTRAMUSCULAR | Status: AC
  Administered 2019-09-21: 10:00:00 80 mg
  Filled 2019-09-21: qty 2

## 2019-09-21 MED ORDER — ROPIVACAINE HCL 2 MG/ML IJ SOLN
18.0000 mL | Freq: Once | INTRAMUSCULAR | Status: AC
  Administered 2019-09-21: 10:00:00 18 mL via PERINEURAL
  Filled 2019-09-21: qty 20

## 2019-09-21 MED ORDER — LIDOCAINE HCL 2 % IJ SOLN
20.0000 mL | Freq: Once | INTRAMUSCULAR | Status: AC
  Administered 2019-09-21: 400 mg
  Filled 2019-09-21: qty 40

## 2019-09-21 MED ORDER — LACTATED RINGERS IV SOLN
1000.0000 mL | Freq: Once | INTRAVENOUS | Status: AC
  Administered 2019-09-21: 1000 mL via INTRAVENOUS

## 2019-09-21 MED ORDER — FENTANYL CITRATE (PF) 100 MCG/2ML IJ SOLN
25.0000 ug | INTRAMUSCULAR | Status: DC | PRN
  Administered 2019-09-21: 100 ug via INTRAVENOUS
  Filled 2019-09-21: qty 2

## 2019-09-21 MED ORDER — MIDAZOLAM HCL 5 MG/5ML IJ SOLN
1.0000 mg | INTRAMUSCULAR | Status: DC | PRN
  Administered 2019-09-21: 10:00:00 3 mg via INTRAVENOUS
  Filled 2019-09-21: qty 5

## 2019-09-21 NOTE — Patient Instructions (Signed)

## 2019-09-21 NOTE — Progress Notes (Signed)
PROVIDER NOTE: Information contained herein reflects review and annotations entered in association with encounter. Interpretation of such information and data should be left to medically-trained personnel. Information provided to patient can be located elsewhere in the medical record under "Patient Instructions". Document created using STT-dictation technology, any transcriptional errors that may result from process are unintentional.    Patient: Tonya Robertson  Service Category: Procedure  Provider: Oswaldo Done, MD  DOB: 13-Dec-1965  DOS: 09/21/2019  Location: [facility]  MRN: [id number]  Setting: Ambulatory - outpatient  Referring Provider: Leda Quail, PA  Type: Established Patient  Specialty: Interventional Pain Management  PCP: Tonya Quail, PA   Primary Reason for Visit: Interventional Pain Management Treatment. CC: Back Pain (lower)  Procedure:          Anesthesia, Analgesia, Anxiolysis:  Type: Lumbar Facet, Medial Branch Block(s) #1  Primary Purpose: Diagnostic Region: Posterolateral Lumbosacral Spine Level: L2, L3, L4, L5, & S1 Medial Branch Level(s). Injecting these levels blocks the L3-4, L4-5, and L5-S1 lumbar facet joints. Laterality: Bilateral  Type: Moderate (Conscious) Sedation combined with Local Anesthesia Indication(s): Analgesia and Anxiety Route: Intravenous (IV) IV Access: Secured Sedation: Meaningful verbal contact was maintained at all times during the procedure  Local Anesthetic: Lidocaine 1-2%  Position: Prone   Indications: 1. Lumbar facet syndrome (Bilateral) (R>L)   2. Lumbosacral facet arthropathy (Multilevel) (Bilateral)   3. Spondylosis without myelopathy or radiculopathy, lumbosacral region   4. DDD (degenerative disc disease), lumbosacral   5. Grade 1 Anterolisthesis of L4 over L5   6. Inflammatory spondylopathy of lumbosacral region (HCC)   7. Chronic low back pain (1ry area of Pain) (Bilateral) (R>L) w/o sciatica    Pain  Score: Pre-procedure: 0-No pain/10 Post-procedure: 0-No pain/10   Pre-op Assessment:  [name] is a 54 y.o. (year old), female patient, seen today for interventional treatment. She  has a past surgical history that includes Abdominal hysterectomy (2008). [name] has a current medication list which includes the following prescription(s): acetaminophen, bupropion, vitamin b-12, cyclobenzaprine, duloxetine, ibuprofen, olmesartan-amlodipine-hctz, albuterol, gabapentin, indomethacin, and saxenda, and the following Facility-Administered Medications: fentanyl and midazolam. Her primarily concern today is the Back Pain (lower)  Initial Vital Signs:  Pulse/HCG Rate: (!) 101ECG Heart Rate: 88 Temp: 98.1 F (36.7 C) Resp: 18 BP: (!) 143/89 SpO2: 98 %  BMI: Estimated body mass index is 34.33 kg/m as calculated from the following:   Height as of this encounter:  (1.626 m).   Weight as of this encounter: 200 lb (90.7 kg).  Risk Assessment: Allergies: Reviewed. She has No Known Allergies.  Allergy Precautions: None required Coagulopathies: Reviewed. None identified.  Blood-thinner therapy: None at this time Active Infection(s): Reviewed. None identified. [name] is afebrile  Site Confirmation: [name] was asked to confirm the procedure and laterality before marking the site Procedure checklist: Completed Consent: Before the procedure and under the influence of no sedative(s), amnesic(s), or anxiolytics, the patient was informed of the treatment options, risks and possible complications. To fulfill our ethical and legal obligations, as recommended by the American Medical Association's Code of Ethics, I have informed the patient of my clinical impression; the nature and purpose of the treatment or procedure; the risks, benefits, and possible complications of the intervention; the alternatives, including doing nothing; the risk(s) and benefit(s) of the alternative treatment(s) or procedure(s); and  the risk(s) and benefit(s) of doing nothing. The patient was provided information about the general risks and possible complications associated with the procedure.  These may include, but are not limited to: failure to achieve desired goals, infection, bleeding, organ or nerve damage, allergic reactions, paralysis, and death. In addition, the patient was informed of those risks and complications associated to Spine-related procedures, such as failure to decrease pain; infection (i.e.: Meningitis, epidural or intraspinal abscess); bleeding (i.e.: epidural hematoma, subarachnoid hemorrhage, or any other type of intraspinal or peri-dural bleeding); organ or nerve damage (i.e.: Any type of peripheral nerve, nerve root, or spinal cord injury) with subsequent damage to sensory, motor, and/or autonomic systems, resulting in permanent pain, numbness, and/or weakness of one or several areas of the body; allergic reactions; (i.e.: anaphylactic reaction); and/or death. Furthermore, the patient was informed of those risks and complications associated with the medications. These include, but are not limited to: allergic reactions (i.e.: anaphylactic or anaphylactoid reaction(s)); adrenal axis suppression; blood sugar elevation that in diabetics may result in ketoacidosis or comma; water retention that in patients with history of congestive heart failure may result in shortness of breath, pulmonary edema, and decompensation with resultant heart failure; weight gain; swelling or edema; medication-induced neural toxicity; particulate matter embolism and blood vessel occlusion with resultant organ, and/or nervous system infarction; and/or aseptic necrosis of one or more joints. Finally, the patient was informed that Medicine is not an exact science; therefore, there is also the possibility of unforeseen or unpredictable risks and/or possible complications that may result in a catastrophic outcome. The patient indicated having  understood very clearly. We have given the patient no guarantees and we have made no promises. Enough time was given to the patient to ask questions, all of which were answered to the patient's satisfaction. Ms. Pledger has indicated that she wanted to continue with the procedure. Attestation: I, the ordering provider, attest that I have discussed with the patient the benefits, risks, side-effects, alternatives, likelihood of achieving goals, and potential problems during recovery for the procedure that I have provided informed consent. Date  Time: 09/21/2019  9:11 AM  Pre-Procedure Preparation:  Monitoring: As per clinic protocol. Respiration, ETCO2, SpO2, BP, heart rate and rhythm monitor placed and checked for adequate function Safety Precautions: Patient was assessed for positional comfort and pressure points before starting the procedure. Time-out: I initiated and conducted the "Time-out" before starting the procedure, as per protocol. The patient was asked to participate by confirming the accuracy of the "Time Out" information. Verification of the correct person, site, and procedure were performed and confirmed by me, the nursing staff, and the patient. "Time-out" conducted as per Joint Commission's Universal Protocol (UP.01.01.01). Time: 0955  Description of Procedure:          Laterality: Bilateral. The procedure was performed in identical fashion on both sides. Levels:  L2, L3, L4, L5, & S1 Medial Branch Level(s) Area Prepped: Posterior Lumbosacral Region DuraPrep (Iodine Povacrylex [0.7% available iodine] and Isopropyl Alcohol, 74% w/w) Safety Precautions: Aspiration looking for blood return was conducted prior to all injections. At no point did we inject any substances, as a needle was being advanced. Before injecting, the patient was told to immediately notify me if she was experiencing any new onset of "ringing in the ears, or metallic taste in the mouth". No attempts were made at seeking  any paresthesias. Safe injection practices and needle disposal techniques used. Medications properly checked for expiration dates. SDV (single dose vial) medications used. After the completion of the procedure, all disposable equipment used was discarded in the proper designated medical waste containers. Local Anesthesia: Protocol guidelines were followed.  The patient was positioned over the fluoroscopy table. The area was prepped in the usual manner. The time-out was completed. The target area was identified using fluoroscopy. A 12-in long, straight, sterile hemostat was used with fluoroscopic guidance to locate the targets for each level blocked. Once located, the skin was marked with an approved surgical skin marker. Once all sites were marked, the skin (epidermis, dermis, and hypodermis), as well as deeper tissues (fat, connective tissue and muscle) were infiltrated with a small amount of a short-acting local anesthetic, loaded on a 10cc syringe with a 25G, 1.5-in  Needle. An appropriate amount of time was allowed for local anesthetics to take effect before proceeding to the next step. Local Anesthetic: Lidocaine 2.0% The unused portion of the local anesthetic was discarded in the proper designated containers. Technical explanation of process:  L2 Medial Branch Nerve Block (MBB): The target area for the L2 medial branch is at the junction of the postero-lateral aspect of the superior articular process and the superior, posterior, and medial edge of the transverse process of L3. Under fluoroscopic guidance, a Quincke needle was inserted until contact was made with os over the superior postero-lateral aspect of the pedicular shadow (target area). After negative aspiration for blood, 0.5 mL of the nerve block solution was injected without difficulty or complication. The needle was removed intact. L3 Medial Branch Nerve Block (MBB): The target area for the L3 medial branch is at the junction of the  postero-lateral aspect of the superior articular process and the superior, posterior, and medial edge of the transverse process of L4. Under fluoroscopic guidance, a Quincke needle was inserted until contact was made with os over the superior postero-lateral aspect of the pedicular shadow (target area). After negative aspiration for blood, 0.5 mL of the nerve block solution was injected without difficulty or complication. The needle was removed intact. L4 Medial Branch Nerve Block (MBB): The target area for the L4 medial branch is at the junction of the postero-lateral aspect of the superior articular process and the superior, posterior, and medial edge of the transverse process of L5. Under fluoroscopic guidance, a Quincke needle was inserted until contact was made with os over the superior postero-lateral aspect of the pedicular shadow (target area). After negative aspiration for blood, 0.5 mL of the nerve block solution was injected without difficulty or complication. The needle was removed intact. L5 Medial Branch Nerve Block (MBB): The target area for the L5 medial branch is at the junction of the postero-lateral aspect of the superior articular process and the superior, posterior, and medial edge of the sacral ala. Under fluoroscopic guidance, a Quincke needle was inserted until contact was made with os over the superior postero-lateral aspect of the pedicular shadow (target area). After negative aspiration for blood, 0.5 mL of the nerve block solution was injected without difficulty or complication. The needle was removed intact. S1 Medial Branch Nerve Block (MBB): The target area for the S1 medial branch is at the posterior and inferior 6 o'clock position of the L5-S1 facet joint. Under fluoroscopic guidance, the Quincke needle inserted for the L5 MBB was redirected until contact was made with os over the inferior and postero aspect of the sacrum, at the 6 o' clock position under the L5-S1 facet joint  (Target area). After negative aspiration for blood, 0.5 mL of the nerve block solution was injected without difficulty or complication. The needle was removed intact.  Nerve block solution: 0.2% PF-Ropivacaine + Triamcinolone (40 mg/mL) diluted to a  final concentration of 4 mg of Triamcinolone/mL of Ropivacaine The unused portion of the solution was discarded in the proper designated containers. Procedural Needles: 22-gauge, 3.5-inch, Quincke needles used for all levels.  Once the entire procedure was completed, the treated area was cleaned, making sure to leave some of the prepping solution back to take advantage of its long term bactericidal properties.   Illustration of the posterior view of the lumbar spine and the posterior neural structures. Laminae of L2 through S1 are labeled. DPRL5, dorsal primary ramus of L5; DPRS1, dorsal primary ramus of S1; DPR3, dorsal primary ramus of L3; FJ, facet (zygapophyseal) joint L3-L4; I, inferior articular process of L4; LB1, lateral branch of dorsal primary ramus of L1; IAB, inferior articular branches from L3 medial branch (supplies L4-L5 facet joint); IBP, intermediate branch plexus; MB3, medial branch of dorsal primary ramus of L3; NR3, third lumbar nerve root; S, superior articular process of L5; SAB, superior articular branches from L4 (supplies L4-5 facet joint also); TP3, transverse process of L3.  Vitals:   09/21/19 1005 09/21/19 1012 09/21/19 1022 09/21/19 1032  BP: 122/81 119/81 121/86 110/81  Pulse:      Resp: 18 16 16 16   Temp:      TempSrc:      SpO2: 96% 98% 97% 97%  Weight:      Height:         Start Time: 0955 hrs. End Time: 1005 hrs.  Imaging Guidance (Spinal):          Type of Imaging Technique: Fluoroscopy Guidance (Spinal) Indication(s): Assistance in needle guidance and placement for procedures requiring needle placement in or near specific anatomical locations not easily accessible without such assistance. Exposure Time:  Please see nurses notes. Contrast: None used. Fluoroscopic Guidance: I was personally present during the use of fluoroscopy. "Tunnel Vision Technique" used to obtain the best possible view of the target area. Parallax error corrected before commencing the procedure. "Direction-depth-direction" technique used to introduce the needle under continuous pulsed fluoroscopy. Once target was reached, antero-posterior, oblique, and lateral fluoroscopic projection used confirm needle placement in all planes. Images permanently stored in EMR. Interpretation: No contrast injected. I personally interpreted the imaging intraoperatively. Adequate needle placement confirmed in multiple planes. Permanent images saved into the patient's record.  Antibiotic Prophylaxis:   Anti-infectives (From admission, onward)   None     Indication(s): None identified  Post-operative Assessment:  Post-procedure Vital Signs:  Pulse/HCG Rate: (!) 10183 Temp: 98.1 F (36.7 C) Resp: 16 BP: 110/81 SpO2: 97 %  EBL: None  Complications: No immediate post-treatment complications observed by team, or reported by patient.  Note: The patient tolerated the entire procedure well. A repeat set of vitals were taken after the procedure and the patient was kept under observation following institutional policy, for this type of procedure. Post-procedural neurological assessment was performed, showing return to baseline, prior to discharge. The patient was provided with post-procedure discharge instructions, including a section on how to identify potential problems. Should any problems arise concerning this procedure, the patient was given instructions to immediately contact , at any time, without hesitation. In any case, we plan to contact the patient by telephone for a follow-up status report regarding this interventional procedure.  Comments:  No additional relevant information.  Plan of Care  Orders:  Orders Placed This Encounter    Procedures  . LUMBAR FACET(MEDIAL BRANCH NERVE BLOCK) MBNB    Scheduling Instructions:     Procedure: Lumbar facet block (AKA.: Lumbosacral medial  branch nerve block)     Side: Bilateral     Level: L3-4, L4-5, & L5-S1 Facets (L2, L3, L4, L5, & S1 Medial Branch Nerves)     Sedation: Patient's choice.     Timeframe: Today    Order Specific Question:   Where will this procedure be performed?    Answer:   [facility]  . DG PA[facility] C-ARM 1-60 MIN NO REPORT    Intraoperative interpretation by procedural physician at [facility].    Standing Status:   Standing    Number of Occurrences:   1    Order Specific Question:   Reason for exam:    Answer:   Assistance in needle guidance and placement for procedures requiring needle placement in or near specific anatomical locations not easily accessible without such assistance.  . Informed Consent Details: Physician/Practitioner Attestation; Transcribe to consent form and obtain patient signature    Nursing Order: Transcribe to consent form and obtain patient signature. Note: Always confirm laterality of pain with Ms. Engelson, before procedure. Procedure: Lumbar Facet Block  under fluoroscopic guidance Indication/Reason: Low Back Pain, with our without leg pain, due to Facet Joint Arthralgia (Joint Pain) known as Lumbar Facet Syndrome, secondary to Lumbar, and/or Lumbosacral Spondylosis (Arthritis of the Spine), without myelopathy or radiculopathy (Nerve Damage). Provider Attestation: I, Macio Kissoon A. Laban EmperorNaveira, MD, (Pain Management Specialist), the physician/practitioner, attest that I have discussed with the patient the benefits, risks, side effects, alternatives, likelihood of achieving goals and potential problems during recovery for the procedure that I have provided informed consent.  . Provide equipment / supplies at bedside    Equipment required: Single use, disposable, "Block Tray"    Standing Status:   Standing    Number of  Occurrences:   1    Order Specific Question:   Specify    Answer:   Block Tray   Chronic Opioid Analgesic:  None Highest recorded MME/day: 25 mg/day MME/day: 0 mg/day   Medications ordered for procedure: Meds ordered this encounter  Medications  . lidocaine (XYLOCAINE) 2 % (with pres) injection 400 mg  . lactated ringers infusion 1,000 mL  . midazolam (VERSED) 5 MG/5ML injection 1-2 mg    Make sure Flumazenil is available in the pyxis when using this medication. If oversedation occurs, administer 0.2 mg IV over 15 sec. If after 45 sec no response, administer 0.2 mg again over 1 min; may repeat at 1 min intervals; not to exceed 4 doses (1 mg)  . fentaNYL (SUBLIMAZE) injection 25-50 mcg    Make sure Narcan is available in the pyxis when using this medication. In the event of respiratory depression (RR< 8/min): Titrate NARCAN (naloxone) in increments of 0.1 to 0.2 mg IV at 2-3 minute intervals, until desired degree of reversal.  . ropivacaine (PF) 2 mg/mL (0.2%) (NAROPIN) injection 18 mL  . triamcinolone acetonide (KENALOG-40) injection 80 mg   Medications administered: We administered lidocaine, lactated ringers, midazolam, fentaNYL, ropivacaine (PF) 2 mg/mL (0.2%), and triamcinolone acetonide.  See the medical record for exact dosing, route, and time of administration.  Follow-up plan:   Return in about 2 weeks (around 10/05/2019) for (PP), (VV).        Interventional treatment options: Planned, scheduled, and/or pending:      Under consideration:   Diagnostic bilateral lumbar facet block #2  Therapeutic bilateral intra-articular L4-5 facet injection  Possible bilateral lumbar facet RFA  Diagnostic left SI joint block  Possible left SI joint RFA  Diagnostic right-sided L2-3 LESI  Diagnostic right-sided L3-4 LESI  Diagnostic right-sided L4-5 LESI  Diagnostic bilateral L1 TFESI  Diagnostic bilateral L2 TFESI  Diagnostic bilateral L3 TFESI  Diagnostic bilateral L4 TFESI    Diagnostic bilateral L5 TFESI    Therapeutic/palliative (PRN):   None at this time    Recent Visits Date Type Provider Dept  08/17/19 Office Visit Milinda Pointer, MD [facility]  Showing recent visits within past 90 days and meeting all other requirements   Today's Visits Date Type Provider Dept  09/21/19 Procedure visit Milinda Pointer, MD [facility]  Showing today's visits and meeting all other requirements   Future Appointments Date Type Provider Dept  10/06/19 Appointment Milinda Pointer, MD [facility]  Showing future appointments within next 90 days and meeting all other requirements   Disposition: Discharge home  Discharge (Date  Time): 09/21/2019; 1032 hrs.   Primary Care Physician: Pearletha Alfred, PA Location: [facility] Note by: Gaspar Cola, MD Date: 09/21/2019; Time: 11:42 AM  Disclaimer:  Medicine is not an Chief Strategy Officer. The only guarantee in medicine is that nothing is guaranteed. It is important to note that the decision to proceed with this intervention was based on the information collected from the patient. The Data and conclusions were drawn from the patient's questionnaire, the interview, and the physical examination. Because the information was provided in large part by the patient, it cannot be guaranteed that it has not been purposely or unconsciously manipulated. Every effort has been made to obtain as much relevant data as possible for this evaluation. It is important to note that the conclusions that lead to this procedure are derived in large part from the available data. Always take into account that the treatment will also be dependent on availability of resources and existing treatment guidelines, considered by other Pain Management Practitioners as being common knowledge and practice, at the time of the intervention. For Medico-Legal purposes, it is also important to point out  that variation in procedural techniques and pharmacological choices are the acceptable norm. The indications, contraindications, technique, and results of the above procedure should only be interpreted and judged by a Board-Certified Interventional Pain Specialist with extensive familiarity and expertise in the same exact procedure and technique.

## 2019-09-21 NOTE — Progress Notes (Signed)
Safety precautions to be maintained throughout the outpatient stay will include: orient to surroundings, keep bed in low position, maintain call bell within reach at all times, provide assistance with transfer out of bed and ambulation.  

## 2019-09-22 ENCOUNTER — Telehealth: Payer: Self-pay

## 2019-09-22 NOTE — Telephone Encounter (Signed)
Post procedure phone call.  Phone was picked up then hung up.  Called again and left message.

## 2019-10-06 ENCOUNTER — Telehealth: Payer: Managed Care, Other (non HMO) | Admitting: Pain Medicine

## 2019-10-12 ENCOUNTER — Telehealth: Payer: Self-pay | Admitting: *Deleted

## 2019-10-12 NOTE — Telephone Encounter (Signed)
Patient called with flare up of pain and is requesting medications. She cancelled her post procedure follow up appointment because she states that she was doing well. She started yesterday with a "flare up" I advised that she needed a follow up visit with Dr. Laban Emperor to discuss her flare up.  ADMIN: Please call and schedule her for an evaluation appointment and let me know when it is done. Thank you- Victorino Dike

## 2019-10-14 ENCOUNTER — Ambulatory Visit: Payer: Managed Care, Other (non HMO) | Attending: Pain Medicine | Admitting: Pain Medicine

## 2019-10-14 ENCOUNTER — Other Ambulatory Visit: Payer: Self-pay

## 2019-10-14 DIAGNOSIS — E538 Deficiency of other specified B group vitamins: Secondary | ICD-10-CM

## 2019-10-14 DIAGNOSIS — M4310 Spondylolisthesis, site unspecified: Secondary | ICD-10-CM | POA: Diagnosis not present

## 2019-10-14 DIAGNOSIS — M545 Low back pain, unspecified: Secondary | ICD-10-CM

## 2019-10-14 DIAGNOSIS — G8929 Other chronic pain: Secondary | ICD-10-CM

## 2019-10-14 DIAGNOSIS — M7918 Myalgia, other site: Secondary | ICD-10-CM

## 2019-10-14 DIAGNOSIS — R700 Elevated erythrocyte sedimentation rate: Secondary | ICD-10-CM

## 2019-10-14 DIAGNOSIS — M792 Neuralgia and neuritis, unspecified: Secondary | ICD-10-CM

## 2019-10-14 DIAGNOSIS — R7982 Elevated C-reactive protein (CRP): Secondary | ICD-10-CM

## 2019-10-14 DIAGNOSIS — M4697 Unspecified inflammatory spondylopathy, lumbosacral region: Secondary | ICD-10-CM | POA: Diagnosis not present

## 2019-10-14 DIAGNOSIS — M47816 Spondylosis without myelopathy or radiculopathy, lumbar region: Secondary | ICD-10-CM

## 2019-10-14 MED ORDER — METHOCARBAMOL 750 MG PO TABS: 750.0000 mg | ORAL_TABLET | Freq: Three times a day (TID) | ORAL | 2 refills | Status: DC | PRN

## 2019-10-14 MED ORDER — GABAPENTIN 300 MG PO CAPS: 900.0000 mg | ORAL_CAPSULE | Freq: Three times a day (TID) | ORAL | 5 refills | Status: DC

## 2019-10-14 MED ORDER — VITAMIN B-12 5000 MCG SL SUBL: 5000.0000 ug | SUBLINGUAL_TABLET | Freq: Every day | SUBLINGUAL | 1 refills | Status: AC

## 2019-10-14 MED ORDER — PREDNISONE 20 MG PO TABS: ORAL_TABLET | ORAL | 0 refills | Status: AC

## 2019-10-14 NOTE — Progress Notes (Signed)
Patient: Tonya Robertson  Service Category: E/M  Provider: Gaspar Cola, MD  DOB: 05-22-1966  DOS: 10/14/2019  Location: Office  MRN: [id number]  Setting: Ambulatory outpatient  Referring Provider: Pearletha Alfred, PA  Type: Established Patient  Specialty: Interventional Pain Management  PCP: Pearletha Alfred, PA  Location: Remote location  Delivery: TeleHealth     Virtual Encounter - Pain Management PROVIDER NOTE: Information contained herein reflects review and annotations entered in association with encounter. Interpretation of such information and data should be left to medically-trained personnel. Information provided to patient can be located elsewhere in the medical record under "Patient Instructions". Document created using STT-dictation technology, any transcriptional errors that may result from process are unintentional.    Contact & Pharmacy Preferred: [phone] Home: [phone] (home) Mobile: [phone] (mobile) E-mail: [id number] Tonya [place], [address] [place] [address] Phone: [phone] Fax: [phone]   Pre-screening  Tonya Robertson offered "in-person" vs "virtual" encounter. She indicated preferring virtual for this encounter.   Reason COVID-19*  Social distancing based on CDC and AMA recommendations.   I contacted Tonya Robertson on 10/14/2019 via telephone.      I clearly identified myself as Gaspar Cola, MD. I verified that I was speaking with the correct person using two identifiers (Name: Tonya Robertson, and date of birth: 03-04-1966).  Consent I sought verbal advanced consent from Tonya Robertson for virtual visit interactions. I informed Tonya Robertson of possible security and privacy concerns, risks, and limitations associated with providing "not-in-person" medical evaluation and management services. I also informed Tonya Robertson of the availability of "in-person"  appointments. Finally, I informed her that there would be a charge for the virtual visit and that she could be  personally, fully or partially, financially responsible for it. Tonya Robertson expressed understanding and agreed to proceed.   Historic Elements   Tonya Robertson is a 54 y.o. year old, female patient evaluated today after her last contact with our practice on 10/12/2019. Tonya Robertson  has no past medical history on file. She also  has a past surgical history that includes Abdominal hysterectomy (2008). Tonya Robertson has a current medication list which includes the following prescription(s): acetaminophen, albuterol, bupropion, duloxetine, gabapentin, ibuprofen, indomethacin, olmesartan-amlodipine-hctz, saxenda, methocarbamol, and prednisone. She  reports that she has been smoking. She has never used smokeless tobacco. She reports current alcohol use. She reports that she does not use drugs. Tonya Robertson has No Known Allergies.   HPI  Today, she is being contacted for worsening of previously known (established) problem.  The patient indicates that after her procedure, she attained 100% relief of her back pain and leg pain.  This 100% relief lasted for over 2 weeks and then she began to experience pain on the left side.  However, this left-sided pain is localized in the area of the buttocks and it does not seem to spread as high into the thoracic region area as it did before we treated her.  In addition, she has not experienced any more pain on the right side since the block.  She refers that despite the fact that she has had epidural steroid injections and facet blocks in the past, this is the first time that she has attained such good relief with those facet blocks.  The primary reason of this is because a lot of physicians tend to forget that 98% of the facet syndrome  cases involves the lower 3 lumbar facets.  This means that if they do any type of procedure that ends up blocking less than those 3 levels, then they  have 98% chance of failing to provide the patient with complete benefit.  We have proven that time and time again but unfortunately we have that some insurance companies have taken steps to make it impossible for the pain physicians to provide the patient with good relief of the pain from facet joint arthropathy due to the fact that they are trying to micromanage these treatments.  In any case, today I have informed the patient that the next step at this point is essentially to have the facet joints repeated on the left side and see if she again gets good relief.  She does, then she would be an excellent candidate for radiofrequency ablation.  She understood and accepted.  I also reminded the patient of the importance of following up after the procedure so that we can get this information back.  Today the patient has requested something for the pain.  She indicates that she still not getting complete relief of the pain from the gabapentin, but she also did not complain of any significant problems and therefore today we will go ahead and refill her gabapentin medication.  She has requested perhaps for [place] to consider a muscle relaxant.  Previously she had used some Flexeril, but she indicated that nothing seems to eliminate the pain.  Because of this statement, I took the time to go over realistic expectations and I have reminded the patient that nothing will completely eliminate the pain.  I also reminded the patient that our primary goal is to simply get it under control.  She indicated today that she understood, but at this point is not under control and she wants to see if there is something that we can do for her.  I will be bringing her back for that second diagnostic injection, which will probably be followed by radiofrequency ablation.  In addition, since the Flexeril did not help, I will be sending a prescription for Robaxin so that she can try that.  I am trying to avoid the use of any opiate analgesics and  she has indicated that she also will prefer to stay away from those.  She has also requested a steroid taper since that seems to have helped in the past.  She denies having any diabetes or any other contraindications to the above-mentioned medications and therefore I have sent a prescription to the pharmacy for those.  I will try to have her come in as soon as possible for the interventional treatment.  Post-Procedure Evaluation  Procedure: Diagnostic bilateral lumbar facet block #1 under fluoroscopic guidance and IV sedation Pre-procedure pain level: 5/10 Post-procedure: 0/10 (100% relief)  Sedation: Sedation provided.  Effectiveness during initial hour after procedure(Ultra-Short Term Relief): 100 % .  Local anesthetic used: Long-acting (4-6 hours) Effectiveness: Defined as any analgesic benefit obtained secondary to the administration of local anesthetics. This carries significant diagnostic value as to the etiological location, or anatomical origin, of the pain. Duration of benefit is expected to coincide with the duration of the local anesthetic used.  Effectiveness during initial 4-6 hours after procedure(Short-Term Relief): 100 % .  Long-term benefit: Defined as any relief past the pharmacologic duration of the local anesthetics.  Effectiveness past the initial 6 hours after procedure(Long-Term Relief): 100 %(3 weeks) .  Current benefits: Defined as benefit that persist at this time.  Analgesia:   100% relief of the pain for 3 weeks.  After that, the pain started to come back on the left side.  She still has 100% relief on the right side and she no longer has any of the leg pain, which confirms that it was referred pain from the facet joints.  Function: Tonya Robertson reports improvement in function ROM: Tonya Robertson reports improvement in ROM  Pharmacotherapy Assessment  Analgesic: None Highest recorded MME/day: 25 mg/day MME/day: 0 mg/day   Monitoring: [place] PMP: PDMP not reviewed this  encounter.       Pharmacotherapy: No side-effects or adverse reactions reported. Compliance: No problems identified. Effectiveness: Clinically acceptable. Plan: Refer to "POC".  UDS: No results found for: SUMMARY Laboratory Chemistry Profile   Renal Lab Results  Component Value Date   BUN 17 08/17/2019   CREATININE 0.87 08/17/2019   BCR 20 08/17/2019   GFRAA 89 08/17/2019   GFRNONAA 77 08/17/2019     Hepatic Lab Results  Component Value Date   AST 15 08/17/2019   ALBUMIN 4.2 08/17/2019   ALKPHOS 110 08/17/2019     Electrolytes Lab Results  Component Value Date   NA 139 08/17/2019   K 4.0 08/17/2019   CL 100 08/17/2019   CALCIUM 9.5 08/17/2019   MG 1.9 08/17/2019     Bone Lab Results  Component Value Date   25OHVITD1 16 (L) 08/17/2019   25OHVITD2 <1.0 08/17/2019   25OHVITD3 15 08/17/2019     Inflammation (CRP: Acute Phase) (ESR: Chronic Phase) Lab Results  Component Value Date   CRP 21 (H) 08/17/2019   ESRSEDRATE 43 (H) 08/17/2019       Note: Above Lab results reviewed.  Imaging  DG PAIN CLINIC C-ARM 1-60 MIN NO REPORT Fluoro was used, but no Radiologist interpretation will be provided.  Please refer to "NOTES" tab for provider progress note.  Assessment  The primary encounter diagnosis was Acute exacerbation of chronic low back pain. Diagnoses of Lumbar facet syndrome (Bilateral) (R>L), Inflammatory spondylopathy of lumbosacral region [facility]), Grade 1 Anterolisthesis of L4 over L5, Chronic low back pain (1ry area of Pain) (Bilateral) (R>L) w/o sciatica, Chronic musculoskeletal pain, and Neurogenic pain were also pertinent to this visit.  Plan of Care  Problem-specific:  No problem-specific Assessment & Plan notes found for this encounter.  Tonya Robertson has a current medication list which includes the following long-term medication(s): albuterol, bupropion, duloxetine, gabapentin, and olmesartan-amlodipine-hctz.  Pharmacotherapy (Medications  Ordered): Meds ordered this encounter  Medications  . gabapentin (NEURONTIN) 300 MG capsule    Sig: Take 3 capsules (900 mg total) by mouth 3 (three) times daily.    Dispense:  270 capsule    Refill:  5    Fill one day early if pharmacy is closed on scheduled refill date. May substitute for generic, or similar, if available.  . methocarbamol (ROBAXIN) 750 MG tablet    Sig: Take 1 tablet (750 mg total) by mouth every 8 (eight) hours as needed for muscle spasms.    Dispense:  90 tablet    Refill:  2    Do not place this medication, or any other prescription from our practice, on "Automatic Refill". Patient may have prescription filled one day early if pharmacy is closed on scheduled refill date.  . predniSONE (DELTASONE) 20 MG tablet    Sig: Take 3 tablets (60 mg total) by mouth daily with breakfast for 3 days, THEN 2 tablets (40 mg total) daily with breakfast for  3 days, THEN 1 tablet (20 mg total) daily with breakfast for 3 days.    Dispense:  18 tablet    Refill:  0   Orders:  Orders Placed This Encounter  Procedures  . LUMBAR FACET(MEDIAL BRANCH NERVE BLOCK) MBNB    Standing Status:   Future    Standing Expiration Date:   11/14/2019    Scheduling Instructions:     Procedure: Lumbar facet block (AKA.: Lumbosacral medial branch nerve block)     Side: Left-sided     Level: L3-4, L4-5, & L5-S1 Facets (L2, L3, L4, L5, & S1 Medial Branch Nerves)     Sedation: With Sedation.     Timeframe: ASAP    Order Specific Question:   Where will this procedure be performed?    Answer:   [facility]   Follow-up plan:   No follow-ups on file.      Interventional treatment options: Planned, scheduled, and/or pending:      Under consideration:   Diagnostic bilateral lumbar facet block #2  Therapeutic bilateral intra-articular L4-5 facet injection  Possible bilateral lumbar facet RFA  Diagnostic left SI joint block  Possible left SI joint RFA  Diagnostic right-sided L2-3 LESI   Diagnostic right-sided L3-4 LESI  Diagnostic right-sided L4-5 LESI  Diagnostic bilateral L1 TFESI  Diagnostic bilateral L2 TFESI  Diagnostic bilateral L3 TFESI  Diagnostic bilateral L4 TFESI  Diagnostic bilateral L5 TFESI    Therapeutic/palliative (PRN):   None at this time    Recent Visits Date Type Provider Dept  09/21/19 Procedure visit Milinda Pointer, MD [facility]  08/17/19 Office Visit Milinda Pointer, MD [facility]  Showing recent visits within past 90 days and meeting all other requirements   Today's Visits Date Type Provider Dept  10/14/19 Telemedicine Milinda Pointer, MD [facility]  Showing today's visits and meeting all other requirements   Future Appointments No visits were found meeting these conditions.  Showing future appointments within next 90 days and meeting all other requirements   I discussed the assessment and treatment plan with the patient. The patient was provided an opportunity to ask questions and all were answered. The patient agreed with the plan and demonstrated an understanding of the instructions.  Patient advised to call back or seek an in-person evaluation if the symptoms or condition worsens.  Duration of encounter: 18 minutes.  Note by: Gaspar Cola, MD Date: 10/14/2019; Time: 2:20 PM

## 2019-10-20 ENCOUNTER — Telehealth: Payer: Self-pay | Admitting: Student in an Organized Health Care Education/Training Program

## 2019-10-20 NOTE — Telephone Encounter (Signed)
Patient wants to know if she can skip another facet injection and just go to the RFA. She has only had one facet with our clinic. She also states they dont work. Her insurance would have to approve an RF. Im not sure she understands that if the facet doesn't work we cannot get the RFA approved. She wants to know if DR. Shireen Quan will skip this facet and do RF  Has appt for facet 10-28-19

## 2019-10-21 NOTE — Telephone Encounter (Signed)
Attempted to call patient, no answer.  Left message that we are unable  to schedule an RF without the proper channels for insurance reasons. Instructed that patient needed to keep her appt 10/28/19 for Lumbar facets

## 2019-10-28 ENCOUNTER — Other Ambulatory Visit: Payer: Self-pay

## 2019-10-28 ENCOUNTER — Ambulatory Visit (HOSPITAL_BASED_OUTPATIENT_CLINIC_OR_DEPARTMENT_OTHER): Payer: Managed Care, Other (non HMO) | Admitting: Pain Medicine

## 2019-10-28 ENCOUNTER — Ambulatory Visit
Admission: RE | Admit: 2019-10-28 | Discharge: 2019-10-28 | Disposition: A | Discharge status: Home / Self Care | Payer: Managed Care, Other (non HMO) | Source: Ambulatory Visit | Attending: Pain Medicine | Admitting: Pain Medicine

## 2019-10-28 ENCOUNTER — Encounter: Payer: Self-pay | Admitting: Pain Medicine

## 2019-10-28 VITALS — BP 110/69 | HR 84 | Temp 98.3°F | Resp 20 | Ht 64.0 in | Wt 199.0 lb

## 2019-10-28 DIAGNOSIS — M5137 Other intervertebral disc degeneration, lumbosacral region: Secondary | ICD-10-CM

## 2019-10-28 DIAGNOSIS — G8929 Other chronic pain: Secondary | ICD-10-CM | POA: Insufficient documentation

## 2019-10-28 DIAGNOSIS — M47816 Spondylosis without myelopathy or radiculopathy, lumbar region: Secondary | ICD-10-CM | POA: Insufficient documentation

## 2019-10-28 DIAGNOSIS — M47817 Spondylosis without myelopathy or radiculopathy, lumbosacral region: Secondary | ICD-10-CM | POA: Insufficient documentation

## 2019-10-28 DIAGNOSIS — M545 Low back pain, unspecified: Secondary | ICD-10-CM

## 2019-10-28 DIAGNOSIS — M4310 Spondylolisthesis, site unspecified: Secondary | ICD-10-CM

## 2019-10-28 DIAGNOSIS — M4697 Unspecified inflammatory spondylopathy, lumbosacral region: Secondary | ICD-10-CM | POA: Insufficient documentation

## 2019-10-28 MED ORDER — FENTANYL CITRATE (PF) 100 MCG/2ML IJ SOLN
25.0000 ug | INTRAMUSCULAR | Status: AC | PRN
  Administered 2019-10-28 (×2): 50 ug via INTRAVENOUS
  Filled 2019-10-28: qty 2

## 2019-10-28 MED ORDER — LIDOCAINE HCL 2 % IJ SOLN
20.0000 mL | Freq: Once | INTRAMUSCULAR | Status: AC
  Administered 2019-10-28: 400 mg
  Filled 2019-10-28: qty 20

## 2019-10-28 MED ORDER — TRIAMCINOLONE ACETONIDE 40 MG/ML IJ SUSP: 40.0000 mg | Freq: Once | INTRAMUSCULAR | Status: DC

## 2019-10-28 MED ORDER — LACTATED RINGERS IV SOLN
1000.0000 mL | Freq: Once | INTRAVENOUS | Status: AC
  Administered 2019-10-28: 1000 mL via INTRAVENOUS

## 2019-10-28 MED ORDER — ROPIVACAINE HCL 2 MG/ML IJ SOLN
9.0000 mL | Freq: Once | INTRAMUSCULAR | Status: AC
  Administered 2019-10-28: 9 mL via PERINEURAL
  Filled 2019-10-28: qty 10

## 2019-10-28 MED ORDER — MIDAZOLAM HCL 5 MG/5ML IJ SOLN
1.0000 mg | INTRAMUSCULAR | Status: DC | PRN
  Administered 2019-10-28: 2 mg via INTRAVENOUS
  Administered 2019-10-28: 1 mg via INTRAVENOUS
  Filled 2019-10-28: qty 5

## 2019-10-28 NOTE — Patient Instructions (Signed)

## 2019-10-28 NOTE — Progress Notes (Signed)
PROVIDER NOTE: Information contained herein reflects review and annotations entered in association with encounter. Interpretation of such information and data should be left to medically-trained personnel. Information provided to patient can be located elsewhere in the medical record under "Patient Instructions". Document created using STT-dictation technology, any transcriptional errors that may result from process are unintentional.    Patient: Tonya Robertson  Service Category: Procedure  Provider: Oswaldo DoneFrancisco A Iness Pangilinan, MD  DOB: 02/26/1966  DOS: 10/28/2019  Location: [facility]  MRN: [id number]  Setting: Ambulatory - outpatient  Referring Provider: Delano MetzNaveira, Aldin Drees, MD  Type: Established Patient  Specialty: Interventional Pain Management  PCP: Leda QuailPurcell, Angela, PA   Primary Reason for Visit: Interventional Pain Management Treatment. CC: Back Pain (lower left)  Procedure:          Anesthesia, Analgesia, Anxiolysis:  Type: Lumbar Facet, Medial Branch Block(s) #2  Primary Purpose: Diagnostic Region: Posterolateral Lumbosacral Spine Level: L2, L3, L4, L5, & S1 Medial Branch Level(s). Injecting these levels blocks the L3-4, L4-5, and L5-S1 lumbar facet joints. Laterality: Left  Type: Moderate (Conscious) Sedation combined with Local Anesthesia Indication(s): Analgesia and Anxiety Route: Intravenous (IV) IV Access: Secured Sedation: Meaningful verbal contact was maintained at all times during the procedure  Local Anesthetic: Lidocaine 1-2%  Position: Prone   Indications: 1. Lumbar facet syndrome (Bilateral) (R>L)   2. Spondylosis without myelopathy or radiculopathy, lumbosacral region   3. Inflammatory spondylopathy of lumbosacral region (HCC)   4. Grade 1 Anterolisthesis of L4 over L5   5. DDD (degenerative disc disease), lumbosacral   6. Chronic low back pain (1ry area of Pain) (Bilateral) (R>L) w/o sciatica    Pain Score: Pre-procedure: 2 /10 Post-procedure: 2 /10    Pre-op Assessment:  Tonya Robertson is a 54 y.o. (year old), female patient, seen today for interventional treatment. She  has a past surgical history that includes Abdominal hysterectomy (2008). Tonya Robertson has a current medication list which includes the following prescription(s): albuterol, bupropion, vitamin b-12, duloxetine, gabapentin, ibuprofen, indomethacin, methocarbamol, olmesartan-amlodipine-hctz, saxenda, and acetaminophen, and the following Facility-Administered Medications: midazolam and triamcinolone acetonide. Her primarily concern today is the Back Pain (lower left)  Initial Vital Signs:  Pulse/HCG Rate: 100ECG Heart Rate: 85 Temp: 98.8 F (37.1 C) Resp: 18 BP: 118/81 SpO2: 100 %  BMI: Estimated body mass index is 34.16 kg/m as calculated from the following:   Height as of this encounter: 5\' 4"  (1.626 m).   Weight as of this encounter: 199 lb (90.3 kg).  Risk Assessment: Allergies: Reviewed. She has No Known Allergies.  Allergy Precautions: None required Coagulopathies: Reviewed. None identified.  Blood-thinner therapy: None at this time Active Infection(s): Reviewed. None identified. Tonya Robertson is afebrile  Site Confirmation: Tonya Robertson was asked to confirm the procedure and laterality before marking the site Procedure checklist: Completed Consent: Before the procedure and under the influence of no sedative(s), amnesic(s), or anxiolytics, the patient was informed of the treatment options, risks and possible complications. To fulfill our ethical and legal obligations, as recommended by the American Medical Association's Code of Ethics, I have informed the patient of my clinical impression; the nature and purpose of the treatment or procedure; the risks, benefits, and possible complications of the intervention; the alternatives, including doing nothing; the risk(s) and benefit(s) of the alternative treatment(s) or procedure(s); and the risk(s) and benefit(s) of doing nothing. The  patient was provided information about the general risks and possible complications associated with the procedure. These may include, but are not limited  to: failure to achieve desired goals, infection, bleeding, organ or nerve damage, allergic reactions, paralysis, and death. In addition, the patient was informed of those risks and complications associated to Spine-related procedures, such as failure to decrease pain; infection (i.e.: Meningitis, epidural or intraspinal abscess); bleeding (i.e.: epidural hematoma, subarachnoid hemorrhage, or any other type of intraspinal or peri-dural bleeding); organ or nerve damage (i.e.: Any type of peripheral nerve, nerve root, or spinal cord injury) with subsequent damage to sensory, motor, and/or autonomic systems, resulting in permanent pain, numbness, and/or weakness of one or several areas of the body; allergic reactions; (i.e.: anaphylactic reaction); and/or death. Furthermore, the patient was informed of those risks and complications associated with the medications. These include, but are not limited to: allergic reactions (i.e.: anaphylactic or anaphylactoid reaction(s)); adrenal axis suppression; blood sugar elevation that in diabetics may result in ketoacidosis or comma; water retention that in patients with history of congestive heart failure may result in shortness of breath, pulmonary edema, and decompensation with resultant heart failure; weight gain; swelling or edema; medication-induced neural toxicity; particulate matter embolism and blood vessel occlusion with resultant organ, and/or nervous system infarction; and/or aseptic necrosis of one or more joints. Finally, the patient was informed that Medicine is not an exact science; therefore, there is also the possibility of unforeseen or unpredictable risks and/or possible complications that may result in a catastrophic outcome. The patient indicated having understood very clearly. We have given the patient no  guarantees and we have made no promises. Enough time was given to the patient to ask questions, all of which were answered to the patient's satisfaction. Ms. Tietje has indicated that she wanted to continue with the procedure. Attestation: I, the ordering provider, attest that I have discussed with the patient the benefits, risks, side-effects, alternatives, likelihood of achieving goals, and potential problems during recovery for the procedure that I have provided informed consent. Date   Time: 10/28/2019 12:27 PM  Pre-Procedure Preparation:  Monitoring: As per clinic protocol. Respiration, ETCO2, SpO2, BP, heart rate and rhythm monitor placed and checked for adequate function Safety Precautions: Patient was assessed for positional comfort and pressure points before starting the procedure. Time-out: I initiated and conducted the "Time-out" before starting the procedure, as per protocol. The patient was asked to participate by confirming the accuracy of the "Time Out" information. Verification of the correct person, site, and procedure were performed and confirmed by me, the nursing staff, and the patient. "Time-out" conducted as per Joint Commission's Universal Protocol (UP.01.01.01). Time: 1303  Description of Procedure:          Laterality: Left Levels:  L2, L3, L4, L5, & S1 Medial Branch Level(s) Area Prepped: Posterior Lumbosacral Region DuraPrep (Iodine Povacrylex [0.7% available iodine] and Isopropyl Alcohol, 74% w/w) Safety Precautions: Aspiration looking for blood return was conducted prior to all injections. At no point did we inject any substances, as a needle was being advanced. Before injecting, the patient was told to immediately notify me if she was experiencing any new onset of "ringing in the ears, or metallic taste in the mouth". No attempts were made at seeking any paresthesias. Safe injection practices and needle disposal techniques used. Medications properly checked for expiration  dates. SDV (single dose vial) medications used. After the completion of the procedure, all disposable equipment used was discarded in the proper designated medical waste containers. Local Anesthesia: Protocol guidelines were followed. The patient was positioned over the fluoroscopy table. The area was prepped in the usual manner. The  time-out was completed. The target area was identified using fluoroscopy. A 12-in long, straight, sterile hemostat was used with fluoroscopic guidance to locate the targets for each level blocked. Once located, the skin was marked with an approved surgical skin marker. Once all sites were marked, the skin (epidermis, dermis, and hypodermis), as well as deeper tissues (fat, connective tissue and muscle) were infiltrated with a small amount of a short-acting local anesthetic, loaded on a 10cc syringe with a 25G, 1.5-in  Needle. An appropriate amount of time was allowed for local anesthetics to take effect before proceeding to the next step. Local Anesthetic: Lidocaine 2.0% The unused portion of the local anesthetic was discarded in the proper designated containers. Technical explanation of process:  L2 Medial Branch Nerve Block (MBB): The target area for the L2 medial branch is at the junction of the postero-lateral aspect of the superior articular process and the superior, posterior, and medial edge of the transverse process of L3. Under fluoroscopic guidance, a Quincke needle was inserted until contact was made with os over the superior postero-lateral aspect of the pedicular shadow (target area). After negative aspiration for blood, 0.5 mL of the nerve block solution was injected without difficulty or complication. The needle was removed intact. L3 Medial Branch Nerve Block (MBB): The target area for the L3 medial branch is at the junction of the postero-lateral aspect of the superior articular process and the superior, posterior, and medial edge of the transverse process of L4.  Under fluoroscopic guidance, a Quincke needle was inserted until contact was made with os over the superior postero-lateral aspect of the pedicular shadow (target area). After negative aspiration for blood, 0.5 mL of the nerve block solution was injected without difficulty or complication. The needle was removed intact. L4 Medial Branch Nerve Block (MBB): The target area for the L4 medial branch is at the junction of the postero-lateral aspect of the superior articular process and the superior, posterior, and medial edge of the transverse process of L5. Under fluoroscopic guidance, a Quincke needle was inserted until contact was made with os over the superior postero-lateral aspect of the pedicular shadow (target area). After negative aspiration for blood, 0.5 mL of the nerve block solution was injected without difficulty or complication. The needle was removed intact. L5 Medial Branch Nerve Block (MBB): The target area for the L5 medial branch is at the junction of the postero-lateral aspect of the superior articular process and the superior, posterior, and medial edge of the sacral ala. Under fluoroscopic guidance, a Quincke needle was inserted until contact was made with os over the superior postero-lateral aspect of the pedicular shadow (target area). After negative aspiration for blood, 0.5 mL of the nerve block solution was injected without difficulty or complication. The needle was removed intact. S1 Medial Branch Nerve Block (MBB): The target area for the S1 medial branch is at the posterior and inferior 6 o'clock position of the L5-S1 facet joint. Under fluoroscopic guidance, the Quincke needle inserted for the L5 MBB was redirected until contact was made with os over the inferior and postero aspect of the sacrum, at the 6 o' clock position under the L5-S1 facet joint (Target area). After negative aspiration for blood, 0.5 mL of the nerve block solution was injected without difficulty or complication.  The needle was removed intact.  Nerve block solution: 0.2% PF-Ropivacaine + Triamcinolone (40 mg/mL) diluted to a final concentration of 4 mg of Triamcinolone/mL of Ropivacaine The unused portion of the solution was discarded  in the proper designated containers. Procedural Needles: 22-gauge, 3.5-inch, Quincke needles used for all levels.  Once the entire procedure was completed, the treated area was cleaned, making sure to leave some of the prepping solution back to take advantage of its long term bactericidal properties.   Illustration of the posterior view of the lumbar spine and the posterior neural structures. Laminae of L2 through S1 are labeled. DPRL5, dorsal primary ramus of L5; DPRS1, dorsal primary ramus of S1; DPR3, dorsal primary ramus of L3; FJ, facet (zygapophyseal) joint L3-L4; I, inferior articular process of L4; LB1, lateral branch of dorsal primary ramus of L1; IAB, inferior articular branches from L3 medial branch (supplies L4-L5 facet joint); IBP, intermediate branch plexus; MB3, medial branch of dorsal primary ramus of L3; NR3, third lumbar nerve root; S, superior articular process of L5; SAB, superior articular branches from L4 (supplies L4-5 facet joint also); TP3, transverse process of L3.  Vitals:   10/28/19 1311 10/28/19 1321 10/28/19 1330 10/28/19 1340  BP: (!) 110/94 109/85 103/66 110/69  Pulse: 84     Resp: 13 20 20 20   Temp:  98.4 F (36.9 C)  98.3 F (36.8 C)  TempSrc:      SpO2: 98% 100% 100% 100%  Weight:      Height:         Start Time: 1303 hrs. End Time: 1310 hrs.  Imaging Guidance (Spinal):          Type of Imaging Technique: Fluoroscopy Guidance (Spinal) Indication(s): Assistance in needle guidance and placement for procedures requiring needle placement in or near specific anatomical locations not easily accessible without such assistance. Exposure Time: Please see nurses notes. Contrast: None used. Fluoroscopic Guidance: I was personally present  during the use of fluoroscopy. "Tunnel Vision Technique" used to obtain the best possible view of the target area. Parallax error corrected before commencing the procedure. "Direction-depth-direction" technique used to introduce the needle under continuous pulsed fluoroscopy. Once target was reached, antero-posterior, oblique, and lateral fluoroscopic projection used confirm needle placement in all planes. Images permanently stored in EMR. Interpretation: No contrast injected. I personally interpreted the imaging intraoperatively. Adequate needle placement confirmed in multiple planes. Permanent images saved into the patient's record.  Antibiotic Prophylaxis:   Anti-infectives (From admission, onward)   None     Indication(s): None identified  Post-operative Assessment:  Post-procedure Vital Signs:  Pulse/HCG Rate: 8482 Temp: 98.3 F (36.8 C) Resp: 20 BP: 110/69 SpO2: 100 %  EBL: None  Complications: No immediate post-treatment complications observed by team, or reported by patient.  Note: The patient tolerated the entire procedure well. A repeat set of vitals were taken after the procedure and the patient was kept under observation following institutional policy, for this type of procedure. Post-procedural neurological assessment was performed, showing return to baseline, prior to discharge. The patient was provided with post-procedure discharge instructions, including a section on how to identify potential problems. Should any problems arise concerning this procedure, the patient was given instructions to immediately contact , at any time, without hesitation. In any case, we plan to contact the patient by telephone for a follow-up status report regarding this interventional procedure.  Comments:  No additional relevant information.  Plan of Care  Orders:  Orders Placed This Encounter  Procedures   LUMBAR FACET(MEDIAL BRANCH NERVE BLOCK) MBNB    Scheduling Instructions:      Procedure: Lumbar facet block (AKA.: Lumbosacral medial branch nerve block)     Side: Left-sided  Level: L3-4, L4-5, & L5-S1 Facets (L2, L3, L4, L5, & S1 Medial Branch Nerves)     Sedation: With Sedation.     Timeframe: Today    Order Specific Question:   Where will this procedure be performed?    Answer:   [facility]   DG [facility] C-ARM 1-60 MIN NO REPORT    Intraoperative interpretation by procedural physician at [facility].    Standing Status:   Standing    Number of Occurrences:   1    Order Specific Question:   Reason for exam:    Answer:   Assistance in needle guidance and placement for procedures requiring needle placement in or near specific anatomical locations not easily accessible without such assistance.   Informed Consent Details: Physician/Practitioner Attestation; Transcribe to consent form and obtain patient signature    Nursing Order: Transcribe to consent form and obtain patient signature. Note: Always confirm laterality of pain with Ms. Pauls, before procedure. Procedure: Lumbar Facet Block  under fluoroscopic guidance Indication/Reason: Low Back Pain, with our without leg pain, due to Facet Joint Arthralgia (Joint Pain) known as Lumbar Facet Syndrome, secondary to Lumbar, and/or Lumbosacral Spondylosis (Arthritis of the Spine), without myelopathy or radiculopathy (Nerve Damage). Provider Attestation: I, Andros Channing A. Laban Emperor, MD, (Pain Management Specialist), the physician/practitioner, attest that I have discussed with the patient the benefits, risks, side effects, alternatives, likelihood of achieving goals and potential problems during recovery for the procedure that I have provided informed consent.   Provide equipment / supplies at bedside    Equipment required: Single use, disposable, "Block Tray"    Standing Status:   Standing    Number of Occurrences:   1    Order Specific Question:   Specify    Answer:   Block Tray   Chronic Opioid  Analgesic:  None Highest recorded MME/day: 25 mg/day MME/day: 0 mg/day   Medications ordered for procedure: Meds ordered this encounter  Medications   lidocaine (XYLOCAINE) 2 % (with pres) injection 400 mg   lactated ringers infusion 1,000 mL   midazolam (VERSED) 5 MG/5ML injection 1-2 mg    Make sure Flumazenil is available in the pyxis when using this medication. If oversedation occurs, administer 0.2 mg IV over 15 sec. If after 45 sec no response, administer 0.2 mg again over 1 min; may repeat at 1 min intervals; not to exceed 4 doses (1 mg)   fentaNYL (SUBLIMAZE) injection 25-50 mcg    Make sure Narcan is available in the pyxis when using this medication. In the event of respiratory depression (RR< 8/min): Titrate NARCAN (naloxone) in increments of 0.1 to 0.2 mg IV at 2-3 minute intervals, until desired degree of reversal.   ropivacaine (PF) 2 mg/mL (0.2%) (NAROPIN) injection 9 mL   triamcinolone acetonide (KENALOG-40) injection 40 mg   Medications administered: We administered lidocaine, lactated ringers, midazolam, fentaNYL, and ropivacaine (PF) 2 mg/mL (0.2%).  See the medical record for exact dosing, route, and time of administration.  Follow-up plan:   Return in about 2 weeks (around 11/11/2019) for (VV), (PP).       Interventional treatment options: Planned, scheduled, and/or pending:      Under consideration:   Diagnostic bilateral lumbar facet block #2  Therapeutic bilateral intra-articular L4-5 facet injection  Possible bilateral lumbar facet RFA  Diagnostic left SI joint block  Possible left SI joint RFA  Diagnostic right-sided L2-3 LESI  Diagnostic right-sided L3-4 LESI  Diagnostic right-sided L4-5 LESI  Diagnostic  bilateral L1 TFESI  Diagnostic bilateral L2 TFESI  Diagnostic bilateral L3 TFESI  Diagnostic bilateral L4 TFESI  Diagnostic bilateral L5 TFESI    Therapeutic/palliative (PRN):   None at this time    Recent Visits Date Type Provider Dept    10/14/19 Telemedicine Milinda Pointer, MD [facility]  09/21/19 Procedure visit Milinda Pointer, MD [facility]  08/17/19 Office Visit Milinda Pointer, MD [facility]  Showing recent visits within past 90 days and meeting all other requirements   Today's Visits Date Type Provider Dept  10/28/19 Procedure visit Milinda Pointer, MD [facility]  Showing today's visits and meeting all other requirements   Future Appointments Date Type Provider Dept  11/10/19 Appointment Milinda Pointer, MD [facility]  Showing future appointments within next 90 days and meeting all other requirements   Disposition: Discharge home  Discharge (Date   Time): 10/28/2019; 1342 hrs.   Primary Care Physician: Pearletha Alfred, PA Location: [facility] Note by: Gaspar Cola, MD Date: 10/28/2019; Time: 2:18 PM  Disclaimer:  Medicine is not an Chief Strategy Officer. The only guarantee in medicine is that nothing is guaranteed. It is important to note that the decision to proceed with this intervention was based on the information collected from the patient. The Data and conclusions were drawn from the patient's questionnaire, the interview, and the physical examination. Because the information was provided in large part by the patient, it cannot be guaranteed that it has not been purposely or unconsciously manipulated. Every effort has been made to obtain as much relevant data as possible for this evaluation. It is important to note that the conclusions that lead to this procedure are derived in large part from the available data. Always take into account that the treatment will also be dependent on availability of resources and existing treatment guidelines, considered by other Pain Management Practitioners as being common knowledge and practice, at the time of the intervention. For Medico-Legal purposes, it is also important to  point out that variation in procedural techniques and pharmacological choices are the acceptable norm. The indications, contraindications, technique, and results of the above procedure should only be interpreted and judged by a Board-Certified Interventional Pain Specialist with extensive familiarity and expertise in the same exact procedure and technique.

## 2019-10-28 NOTE — Progress Notes (Signed)
Safety precautions to be maintained throughout the outpatient stay will include: orient to surroundings, keep bed in low position, maintain call bell within reach at all times, provide assistance with transfer out of bed and ambulation.  

## 2019-10-29 ENCOUNTER — Telehealth: Payer: Self-pay | Admitting: *Deleted

## 2019-10-29 NOTE — Telephone Encounter (Signed)
No problems post procedure. 

## 2019-11-09 ENCOUNTER — Telehealth: Payer: Self-pay

## 2019-11-09 NOTE — Telephone Encounter (Signed)
Attempted to call patient for pre virtual appointment questions.  LM 

## 2019-11-10 ENCOUNTER — Ambulatory Visit: Payer: Managed Care, Other (non HMO) | Admitting: Pain Medicine

## 2019-11-11 ENCOUNTER — Telehealth: Payer: Self-pay | Admitting: *Deleted

## 2019-11-11 ENCOUNTER — Telehealth: Payer: Self-pay | Admitting: Pain Medicine

## 2019-11-11 NOTE — Telephone Encounter (Signed)
Spoke with patient, let her know that it would be best to keep her appt on Monday for f/up.  She is concerned about cost and doing extraneous steps that would cost more but not potentially solve the problem.  Explained that, this next visit is for f/up which is leading up to the RFA and must be done in order to get insurance approval for the RFA procedure.  Patient was a little confused with information.  Explanation given as to why we do the f/up visits between procedures.  Patient verbalized u/o information and will keep appt for Monday 11/15/19.

## 2019-11-11 NOTE — Telephone Encounter (Signed)
Patient called stating she is in extreme pain, bent over, cannot work. Says last procedure did not give her any relief. She wanted to see if she could get something called in for her pain. I did explain Dr. Laban Emperor does not call in pain meds. She has appt Mon at 7:45 for follow up. She wanted to cancel that and schedule RFA. I did explain that the appointment Monday was the next step in trying to help with her pain. She was not satisfied. Im not sure what else to do for her.

## 2019-11-11 NOTE — Telephone Encounter (Signed)
Voicemail left with patient to please call our office prior to appt on 11/15/19 to review procedure outcome and medications.

## 2019-11-14 NOTE — Progress Notes (Signed)
Patient: Tonya Robertson  Service Category: E/M  Provider: Gaspar Cola, MD  DOB: [birth year]  DOS: 11/15/2019  Location: Office  MRN: [id number]  Setting: Ambulatory outpatient  Referring Provider: Pearletha Alfred, PA  Type: Established Patient  Specialty: Interventional Pain Management  PCP: Pearletha Alfred, PA  Location: Remote location  Delivery: TeleHealth     Virtual Encounter - Pain Management PROVIDER NOTE: Information contained herein reflects review and annotations entered in association with encounter. Interpretation of such information and data should be left to medically-trained personnel. Information provided to patient can be located elsewhere in the medical record under "Patient Instructions". Document created using STT-dictation technology, any transcriptional errors that may result from process are unintentional.    Contact & Pharmacy Preferred: [phone] Home: [phone] (home) Mobile: [phone] (mobile) E-mail: [id number] [address] [place] [address] Phone: [phone] Fax: [phone]   Pre-screening  Tonya Robertson offered "in-person" vs "virtual" encounter. She indicated preferring virtual for this encounter.   Reason COVID-19*   Social distancing based on CDC and AMA recommendations.   I contacted Tonya Robertson on 11/15/2019 via telephone.      I clearly identified myself as Gaspar Cola, MD. I verified that I was speaking with the correct person using two identifiers (Name: Tonya Robertson, and date of birth: [birth year]).  Consent I sought verbal advanced consent from Tonya Robertson for virtual visit interactions. I informed Tonya Robertson of possible security and privacy concerns, risks, and limitations associated with providing "not-in-person" medical evaluation and management services. I also informed Tonya Robertson of the availability of "in-person"  appointments. Finally, I informed her that there would be a charge for the virtual visit and that she could be  personally, fully or partially, financially responsible for it. Ms. Gasser expressed understanding and agreed to proceed.   Historic Elements   Tonya Robertson is a 54 y.o. year old, female patient evaluated today after her last contact with our practice on 11/11/2019. Tonya Robertson  has no past medical history on file. She also  has a past surgical history that includes Abdominal hysterectomy (2008). Tonya Robertson has a current medication list which includes the following prescription(s): acetaminophen, albuterol, bupropion, vitamin b-12, duloxetine, gabapentin, ibuprofen, indomethacin, olmesartan-amlodipine-hctz, and saxenda. She  reports that she has been smoking. She has never used smokeless tobacco. She reports current alcohol use. She reports that she does not use drugs. Tonya Robertson has No Known Allergies.   HPI  Today, she is being contacted for a post-procedure assessment.  Today we talked about the results of the 2 diagnostic lumbar facet blocks and clearly she gets 100% relief of the pain for the duration of the local anesthetic confirming that the facet joints seem to be contributing significantly to the etiology of her pain.  Therefore, we will go ahead and plan on doing radiofrequency ablation of the lumbar facets starting with the left side.  The patient already indicated having had physical therapy, which did not help with her pain.  Post-Procedure Evaluation  Procedure: Diagnostic left-sided lumbar facet block #2 under fluoroscopic guidance and IV sedation Pre-procedure pain level: 2/10 Post-procedure: 2/10 No relief  Sedation: Sedation provided.  Effectiveness during initial hour after procedure(Ultra-Short Term Relief): 100 %.  Local anesthetic used: Long-acting (4-6 hours) Effectiveness: Defined as any analgesic benefit obtained secondary to the administration of local anesthetics. This  carries significant diagnostic  value as to the etiological location, or anatomical origin, of the pain. Duration of benefit is expected to coincide with the duration of the local anesthetic used.  Effectiveness during initial 4-6 hours after procedure(Short-Term Relief): 100 %.  Long-term benefit: Defined as any relief past the pharmacologic duration of the local anesthetics.  Effectiveness past the initial 6 hours after procedure(Long-Term Relief): 100 % (procedure on Thursday,  good relief on Th, F, S and then pain returned on Sunday and has gradually gotten back to the same severity as before procedure.).  Current benefits: Defined as benefit that persist at this time.   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 Over-the-counter oral analgesic medications (i.e.: ibuprofen, naproxen, etc.) 2. Anti-inflammatory medications 3. Muscle relaxants 4. Membrane stabilizers 5. Opioids 6. Physical therapy (PT), chiropractic manipulation, and/or home exercise program (HEP). 7. Modalities (Heat, ice, etc.) 8. Invasive techniques such as nerve blocks.  Tonya Robertson has attained greater than 50% reduction in pain from at least two (2) diagnostic medial branch blocks conducted in separate occasions. For this reason, I believe it is medically necessary to proceed with Non-Pulsed Radiofrequency Ablation for the purpose of attempting to prolong the duration of the benefits seen with the diagnostic injections.  Pharmacotherapy Assessment  Analgesic:  None Highest recorded MME/day: 25 mg/day MME/day: 0 mg/day   Monitoring: [place] PMP: PDMP reviewed during this encounter.       Pharmacotherapy: No side-effects or adverse reactions reported. Compliance: No problems identified. Effectiveness: Clinically acceptable. Plan: Refer to "POC".  UDS: No results found for: SUMMARY  Laboratory Chemistry Profile   Renal Lab Results  Component Value Date   BUN 17 08/17/2019   CREATININE 0.87 08/17/2019   BCR 20 08/17/2019   GFRAA 89 08/17/2019   GFRNONAA 77 08/17/2019     Hepatic Lab Results  Component Value Date   AST 15 08/17/2019   ALBUMIN 4.2 08/17/2019   ALKPHOS 110 08/17/2019     Electrolytes Lab Results  Component Value Date   NA 139 08/17/2019   K 4.0 08/17/2019   CL 100 08/17/2019   CALCIUM 9.5 08/17/2019   MG 1.9 08/17/2019     Bone Lab Results  Component Value Date   25OHVITD1 16 (L) 08/17/2019   25OHVITD2 <1.0 08/17/2019   25OHVITD3 15 08/17/2019     Inflammation (CRP: Acute Phase) (ESR: Chronic Phase) Lab Results  Component Value Date   CRP 21 (H) 08/17/2019   ESRSEDRATE 43 (H) 08/17/2019       Note: Above Lab results reviewed.  Imaging  DG PAIN CLINIC C-ARM 1-60 MIN NO REPORT Fluoro was used, but no Radiologist interpretation will be provided.  Please refer to "NOTES" tab for provider progress note.    Assessment  The primary encounter diagnosis was Chronic low back pain (1ry area of Pain) (Bilateral) (R>L) w/o sciatica. 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  Plan of Care  Problem-specific:  No problem-specific Assessment &  Plan notes found for this encounter.  Tonya Robertson has a current medication list which includes the following long-term medication(s): albuterol, bupropion,  duloxetine, gabapentin, and olmesartan-amlodipine-hctz.  Pharmacotherapy (Medications Ordered): No orders of the defined types were placed in this encounter.  Orders:  Orders Placed This Encounter  Procedures   Radiofrequency,Lumbar    Standing Status:   Future    Standing Expiration Date:   11/14/2020    Scheduling Instructions:     Side(s): Left-sided     Level: L3-4, L4-5, & L5-S1 Facets (L2, L3, L4, L5, & S1 Medial Branch Nerves)     Sedation: With Sedation.     Scheduling Timeframe: As soon as pre-approved    Order Specific Question:   Where will this procedure be performed?    Answer:   [facility]   Follow-up plan:   Return for RFA (w/ sedation): (L) L-FCT RFA #1.      Interventional treatment options: Planned, scheduled, and/or pending:   Therapeutic left-sided lumbar facet RFA #1    Under consideration:   Diagnostic bilateral lumbar facet block #2  Therapeutic bilateral intra-articular L4-5 facet injection  Possible bilateral lumbar facet RFA  Diagnostic left SI joint block  Possible left SI joint RFA  Diagnostic right-sided L2-3 LESI  Diagnostic right-sided L3-4 LESI  Diagnostic right-sided L4-5 LESI  Diagnostic bilateral L1 TFESI  Diagnostic bilateral L2 TFESI  Diagnostic bilateral L3 TFESI  Diagnostic bilateral L4 TFESI  Diagnostic bilateral L5 TFESI    Therapeutic/palliative (PRN):   None at this time    Recent Visits Date Type Provider Dept  10/28/19 Procedure visit Milinda Pointer, MD [facility]  10/14/19 Telemedicine Milinda Pointer, MD [facility]  09/21/19 Procedure visit Milinda Pointer, MD [facility]  08/17/19 Office Visit Milinda Pointer, MD [facility]  Showing recent visits within past 90 days and meeting all other requirements Today's Visits Date Type Provider Dept  11/15/19 Telemedicine Milinda Pointer, MD [facility]  Showing today's visits and meeting all other  requirements Future Appointments No visits were found meeting these conditions. Showing future appointments within next 90 days and meeting all other requirements  I discussed the assessment and treatment plan with the patient. The patient was provided an opportunity to ask questions and all were answered. The patient agreed with the plan and demonstrated an understanding of the instructions.  Patient advised to call back or seek an in-person evaluation if the symptoms or condition worsens.  Duration of encounter: 15 minutes.  Note by: Gaspar Cola, MD Date: 11/15/2019; Time: 8:08 AM

## 2019-11-15 ENCOUNTER — Other Ambulatory Visit: Payer: Self-pay

## 2019-11-15 ENCOUNTER — Ambulatory Visit: Payer: Managed Care, Other (non HMO) | Attending: Pain Medicine | Admitting: Pain Medicine

## 2019-11-15 DIAGNOSIS — M545 Low back pain: Secondary | ICD-10-CM

## 2019-11-15 DIAGNOSIS — M4697 Unspecified inflammatory spondylopathy, lumbosacral region: Secondary | ICD-10-CM

## 2019-11-15 DIAGNOSIS — M4310 Spondylolisthesis, site unspecified: Secondary | ICD-10-CM

## 2019-11-15 DIAGNOSIS — M47816 Spondylosis without myelopathy or radiculopathy, lumbar region: Secondary | ICD-10-CM | POA: Diagnosis not present

## 2019-11-15 DIAGNOSIS — G8929 Other chronic pain: Secondary | ICD-10-CM

## 2019-11-15 DIAGNOSIS — M5127 Other intervertebral disc displacement, lumbosacral region: Secondary | ICD-10-CM

## 2019-11-15 NOTE — Patient Instructions (Signed)
____________________________________________________________________________________________  Preparing for Procedure with Sedation  Procedure appointments are limited to planned procedures: . No Prescription Refills. . No disability issues will be discussed. . No medication changes will be discussed.  Instructions: . Oral Intake: Do not eat or drink anything for at least 8 hours prior to your procedure. (Exception: Blood Pressure Medication. See below.) . Transportation: Unless otherwise stated by your physician, you may drive yourself after the procedure. . Blood Pressure Medicine: Do not forget to take your blood pressure medicine with a sip of water the morning of the procedure. If your Diastolic (lower reading)is above 100 mmHg, elective cases will be cancelled/rescheduled. . Blood thinners: These will need to be stopped for procedures. Notify our staff if you are taking any blood thinners. Depending on which one you take, there will be specific instructions on how and when to stop it. . Diabetics on insulin: Notify the staff so that you can be scheduled 1st case in the morning. If your diabetes requires high dose insulin, take only  of your normal insulin dose the morning of the procedure and notify the staff that you have done so. . Preventing infections: Shower with an antibacterial soap the morning of your procedure. . Build-up your immune system: Take 1000 mg of Vitamin C with every meal (3 times a day) the day prior to your procedure. . Antibiotics: Inform the staff if you have a condition or reason that requires you to take antibiotics before dental procedures. . Pregnancy: If you are pregnant, call and cancel the procedure. . Sickness: If you have a cold, fever, or any active infections, call and cancel the procedure. . Arrival: You must be in the facility at least 30 minutes prior to your scheduled procedure. . Children: Do not bring children with you. . Dress appropriately:  Bring dark clothing that you would not mind if they get stained. . Valuables: Do not bring any jewelry or valuables.  Reasons to call and reschedule or cancel your procedure: (Following these recommendations will minimize the risk of a serious complication.) . Surgeries: Avoid having procedures within 2 weeks of any surgery. (Avoid for 2 weeks before or after any surgery). . Flu Shots: Avoid having procedures within 2 weeks of a flu shots or . (Avoid for 2 weeks before or after immunizations). . Barium: Avoid having a procedure within 7-10 days after having had a radiological study involving the use of radiological contrast. (Myelograms, Barium swallow or enema study). . Heart attacks: Avoid any elective procedures or surgeries for the initial 6 months after a "Myocardial Infarction" (Heart Attack). . Blood thinners: It is imperative that you stop these medications before procedures. Let us know if you if you take any blood thinner.  . Infection: Avoid procedures during or within two weeks of an infection (including chest colds or gastrointestinal problems). Symptoms associated with infections include: Localized redness, fever, chills, night sweats or profuse sweating, burning sensation when voiding, cough, congestion, stuffiness, runny nose, sore throat, diarrhea, nausea, vomiting, cold or Flu symptoms, recent or current infections. It is specially important if the infection is over the area that we intend to treat. . Heart and lung problems: Symptoms that may suggest an active cardiopulmonary problem include: cough, chest pain, breathing difficulties or shortness of breath, dizziness, ankle swelling, uncontrolled high or unusually low blood pressure, and/or palpitations. If you are experiencing any of these symptoms, cancel your procedure and contact your primary care physician for an evaluation.  Remember:  Regular Business hours are:    Monday to Thursday 8:00 AM to 4:00 PM  Provider's  Schedule: Saba Gomm, MD:  Procedure days: Tuesday and Thursday 7:30 AM to 4:00 PM  Bilal Lateef, MD:  Procedure days: Monday and Wednesday 7:30 AM to 4:00 PM ____________________________________________________________________________________________    

## 2019-12-02 ENCOUNTER — Telehealth: Payer: Self-pay | Admitting: *Deleted

## 2019-12-02 ENCOUNTER — Encounter: Payer: Self-pay | Admitting: Pain Medicine

## 2019-12-02 ENCOUNTER — Ambulatory Visit (HOSPITAL_BASED_OUTPATIENT_CLINIC_OR_DEPARTMENT_OTHER): Payer: Managed Care, Other (non HMO) | Admitting: Pain Medicine

## 2019-12-02 ENCOUNTER — Ambulatory Visit
Admission: RE | Admit: 2019-12-02 | Discharge: 2019-12-02 | Disposition: A | Discharge status: Home / Self Care | Payer: Managed Care, Other (non HMO) | Source: Ambulatory Visit | Attending: Pain Medicine | Admitting: Pain Medicine

## 2019-12-02 ENCOUNTER — Other Ambulatory Visit: Payer: Self-pay

## 2019-12-02 VITALS — BP 124/80 | HR 100 | Temp 98.2°F | Resp 14 | Ht 64.0 in | Wt 200.0 lb

## 2019-12-02 DIAGNOSIS — G8918 Other acute postprocedural pain: Secondary | ICD-10-CM | POA: Insufficient documentation

## 2019-12-02 DIAGNOSIS — M545 Low back pain: Secondary | ICD-10-CM

## 2019-12-02 DIAGNOSIS — M4310 Spondylolisthesis, site unspecified: Secondary | ICD-10-CM | POA: Diagnosis present

## 2019-12-02 DIAGNOSIS — M47816 Spondylosis without myelopathy or radiculopathy, lumbar region: Secondary | ICD-10-CM | POA: Insufficient documentation

## 2019-12-02 DIAGNOSIS — M5137 Other intervertebral disc degeneration, lumbosacral region: Secondary | ICD-10-CM | POA: Insufficient documentation

## 2019-12-02 DIAGNOSIS — G8929 Other chronic pain: Secondary | ICD-10-CM | POA: Insufficient documentation

## 2019-12-02 DIAGNOSIS — M47817 Spondylosis without myelopathy or radiculopathy, lumbosacral region: Secondary | ICD-10-CM | POA: Diagnosis not present

## 2019-12-02 MED ORDER — LIDOCAINE HCL 2 % IJ SOLN
INTRAMUSCULAR | Status: AC
  Filled 2019-12-02: qty 20

## 2019-12-02 MED ORDER — LACTATED RINGERS IV SOLN
1000.0000 mL | Freq: Once | INTRAVENOUS | Status: AC
  Administered 2019-12-02: 1000 mL via INTRAVENOUS

## 2019-12-02 MED ORDER — LIDOCAINE HCL 2 % IJ SOLN
20.0000 mL | Freq: Once | INTRAMUSCULAR | Status: AC
  Administered 2019-12-02: 400 mg

## 2019-12-02 MED ORDER — FENTANYL CITRATE (PF) 100 MCG/2ML IJ SOLN
INTRAMUSCULAR | Status: AC
  Filled 2019-12-02: qty 2

## 2019-12-02 MED ORDER — TRIAMCINOLONE ACETONIDE 40 MG/ML IJ SUSP
INTRAMUSCULAR | Status: AC
  Filled 2019-12-02: qty 1

## 2019-12-02 MED ORDER — HYDROCODONE-ACETAMINOPHEN 5-325 MG PO TABS: 1.0000 | ORAL_TABLET | Freq: Four times a day (QID) | ORAL | 0 refills | Status: AC | PRN

## 2019-12-02 MED ORDER — ROPIVACAINE HCL 2 MG/ML IJ SOLN
INTRAMUSCULAR | Status: AC
  Filled 2019-12-02: qty 10

## 2019-12-02 MED ORDER — MIDAZOLAM HCL 5 MG/5ML IJ SOLN
INTRAMUSCULAR | Status: AC
  Filled 2019-12-02: qty 5

## 2019-12-02 MED ORDER — FENTANYL CITRATE (PF) 100 MCG/2ML IJ SOLN
25.0000 ug | INTRAMUSCULAR | Status: DC | PRN
  Administered 2019-12-02: 100 ug via INTRAVENOUS

## 2019-12-02 MED ORDER — TRIAMCINOLONE ACETONIDE 40 MG/ML IJ SUSP
40.0000 mg | Freq: Once | INTRAMUSCULAR | Status: AC
  Administered 2019-12-02: 40 mg

## 2019-12-02 MED ORDER — MIDAZOLAM HCL 5 MG/5ML IJ SOLN
1.0000 mg | INTRAMUSCULAR | Status: DC | PRN
  Administered 2019-12-02: 5 mg via INTRAVENOUS

## 2019-12-02 MED ORDER — ROPIVACAINE HCL 2 MG/ML IJ SOLN
9.0000 mL | Freq: Once | INTRAMUSCULAR | Status: AC
  Administered 2019-12-02: 9 mL via PERINEURAL

## 2019-12-02 NOTE — Telephone Encounter (Signed)
E-scribed and confirmed with pharmacy.

## 2019-12-02 NOTE — Progress Notes (Signed)
PROVIDER NOTE: Information contained herein reflects review and annotations entered in association with encounter. Interpretation of such information and data should be left to medically-trained personnel. Information provided to patient can be located elsewhere in the medical record under "Patient Instructions". Document created using STT-dictation technology, any transcriptional errors that may result from process are unintentional.    Patient: Tonya Robertson  Service Category: Procedure  Provider: Oswaldo Done, MD  DOB: 10/17/1965  DOS: 12/02/2019  Location: [facility]  MRN: [id number]  Setting: Ambulatory - outpatient  Referring Provider: Delano Metz, MD  Type: Established Patient  Specialty: Interventional Pain Management  PCP: Leda Quail, PA   Primary Reason for Visit: Interventional Pain Management Treatment. CC: Back Pain (low)  Procedure:          Anesthesia, Analgesia, Anxiolysis:  Type: Thermal Lumbar Facet, Medial Branch Radiofrequency Ablation/Neurotomy  #1  Primary Purpose: Therapeutic Region: Posterolateral Lumbosacral Spine Level: L2, L3, L4, L5, & S1 Medial Branch Level(s). These levels will denervate the L3-4, L4-5, and the L5-S1 lumbar facet joints. Laterality: Left  Type: Moderate (Conscious) Sedation combined with Local Anesthesia Indication(s): Analgesia and Anxiety Route: Intravenous (IV) IV Access: Secured Sedation: Meaningful verbal contact was maintained at all times during the procedure  Local Anesthetic: Lidocaine 1-2%  Position: Prone   Indications: 1. Spondylosis without myelopathy or radiculopathy, lumbosacral region   2. Lumbar facet syndrome (Bilateral) (R>L)   3. Lumbosacral facet arthropathy (Multilevel) (Bilateral)   4. Grade 1 Anterolisthesis of L4 over L5   5. DDD (degenerative disc disease), lumbosacral   6. Chronic low back pain (1ry area of Pain) (Bilateral) (R>L) w/o sciatica    Tonya Robertson has been dealing with the  above chronic pain for longer than three months and has either failed to respond, was unable to tolerate, or simply did not get enough benefit from other more conservative therapies including, but not limited to: 1. Over-the-counter medications 2. Anti-inflammatory medications 3. Muscle relaxants 4. Membrane stabilizers 5. Opioids 6. Physical therapy and/or chiropractic manipulation 7. Modalities (Heat, ice, etc.) 8. Invasive techniques such as nerve blocks. Ms. Wideman has attained more than 50% relief of the pain from a series of diagnostic injections conducted in separate occasions.  Pain Score: Pre-procedure: 0-No pain/10 Post-procedure: 0-No pain/10  Pre-op Assessment:  Tonya Robertson is a 54 y.o. (year old), female patient, seen today for interventional treatment. She  has a past surgical history that includes Abdominal hysterectomy (2008). Tonya Robertson has a current medication list which includes the following prescription(s): acetaminophen, albuterol, bupropion, vitamin b-12, duloxetine, gabapentin, ibuprofen, indomethacin, olmesartan-amlodipine-hctz, saxenda, hydrocodone-acetaminophen, and [START ON 12/09/2019] hydrocodone-acetaminophen, and the following Facility-Administered Medications: fentanyl and midazolam. Her primarily concern today is the Back Pain (low)  Initial Vital Signs:  Pulse/HCG Rate: 100ECG Heart Rate: 99 Temp: 98.5 F (36.9 C) Resp: 18 BP: 133/84 SpO2: 99 %  BMI: Estimated body mass index is 34.33 kg/m as calculated from the following:   Height as of this encounter:  (1.626 m).   Weight as of this encounter: 200 lb (90.7 kg).  Risk Assessment: Allergies: Reviewed. She has No Known Allergies.  Allergy Precautions: None required Coagulopathies: Reviewed. None identified.  Blood-thinner therapy: None at this time Active Infection(s): Reviewed. None identified. Tonya Robertson is afebrile  Site Confirmation: Tonya Robertson was asked to confirm the procedure and laterality  before marking the site Procedure checklist: Completed Consent: Before the procedure and under the influence of no sedative(s), amnesic(s), or anxiolytics, the patient was  informed of the treatment options, risks and possible complications. To fulfill our ethical and legal obligations, as recommended by the American Medical Association's Code of Ethics, I have informed the patient of my clinical impression; the nature and purpose of the treatment or procedure; the risks, benefits, and possible complications of the intervention; the alternatives, including doing nothing; the risk(s) and benefit(s) of the alternative treatment(s) or procedure(s); and the risk(s) and benefit(s) of doing nothing. The patient was provided information about the general risks and possible complications associated with the procedure. These may include, but are not limited to: failure to achieve desired goals, infection, bleeding, organ or nerve damage, allergic reactions, paralysis, and death. In addition, the patient was informed of those risks and complications associated to Spine-related procedures, such as failure to decrease pain; infection (i.e.: Meningitis, epidural or intraspinal abscess); bleeding (i.e.: epidural hematoma, subarachnoid hemorrhage, or any other type of intraspinal or peri-dural bleeding); organ or nerve damage (i.e.: Any type of peripheral nerve, nerve root, or spinal cord injury) with subsequent damage to sensory, motor, and/or autonomic systems, resulting in permanent pain, numbness, and/or weakness of one or several areas of the body; allergic reactions; (i.e.: anaphylactic reaction); and/or death. Furthermore, the patient was informed of those risks and complications associated with the medications. These include, but are not limited to: allergic reactions (i.e.: anaphylactic or anaphylactoid reaction(s)); adrenal axis suppression; blood sugar elevation that in diabetics may result in ketoacidosis or comma;  water retention that in patients with history of congestive heart failure may result in shortness of breath, pulmonary edema, and decompensation with resultant heart failure; weight gain; swelling or edema; medication-induced neural toxicity; particulate matter embolism and blood vessel occlusion with resultant organ, and/or nervous system infarction; and/or aseptic necrosis of one or more joints. Finally, the patient was informed that Medicine is not an exact science; therefore, there is also the possibility of unforeseen or unpredictable risks and/or possible complications that may result in a catastrophic outcome. The patient indicated having understood very clearly. We have given the patient no guarantees and we have made no promises. Enough time was given to the patient to ask questions, all of which were answered to the patient's satisfaction. Ms. Gato has indicated that she wanted to continue with the procedure. Attestation: I, the ordering provider, attest that I have discussed with the patient the benefits, risks, side-effects, alternatives, likelihood of achieving goals, and potential problems during recovery for the procedure that I have provided informed consent. Date  Time: 12/02/2019 10:10 AM  Pre-Procedure Preparation:  Monitoring: As per clinic protocol. Respiration, ETCO2, SpO2, BP, heart rate and rhythm monitor placed and checked for adequate function Safety Precautions: Patient was assessed for positional comfort and pressure points before starting the procedure. Time-out: I initiated and conducted the "Time-out" before starting the procedure, as per protocol. The patient was asked to participate by confirming the accuracy of the "Time Out" information. Verification of the correct person, site, and procedure were performed and confirmed by me, the nursing staff, and the patient. "Time-out" conducted as per Joint Commission's Universal Protocol (UP.01.01.01). Time: 1118  Description of  Procedure:          Laterality: Left Levels:  L2, L3, L4, L5, & S1 Medial Branch Level(s), at the L3-4, L4-5, and the L5-S1 lumbar facet joints. Area Prepped: Lumbosacral DuraPrep (Iodine Povacrylex [0.7% available iodine] and Isopropyl Alcohol, 74% w/w) Safety Precautions: Aspiration looking for blood return was conducted prior to all injections. At no point did we inject  any substances, as a needle was being advanced. Before injecting, the patient was told to immediately notify me if she was experiencing any new onset of "ringing in the ears, or metallic taste in the mouth". No attempts were made at seeking any paresthesias. Safe injection practices and needle disposal techniques used. Medications properly checked for expiration dates. SDV (single dose vial) medications used. After the completion of the procedure, all disposable equipment used was discarded in the proper designated medical waste containers. Local Anesthesia: Protocol guidelines were followed. The patient was positioned over the fluoroscopy table. The area was prepped in the usual manner. The time-out was completed. The target area was identified using fluoroscopy. A 12-in long, straight, sterile hemostat was used with fluoroscopic guidance to locate the targets for each level blocked. Once located, the skin was marked with an approved surgical skin marker. Once all sites were marked, the skin (epidermis, dermis, and hypodermis), as well as deeper tissues (fat, connective tissue and muscle) were infiltrated with a small amount of a short-acting local anesthetic, loaded on a 10cc syringe with a 25G, 1.5-in  Needle. An appropriate amount of time was allowed for local anesthetics to take effect before proceeding to the next step. Local Anesthetic: Lidocaine 2.0% The unused portion of the local anesthetic was discarded in the proper designated containers. Technical explanation of process:  Radiofrequency Ablation (RFA) L2 Medial Branch Nerve  RFA: The target area for the L2 medial branch is at the junction of the postero-lateral aspect of the superior articular process and the superior, posterior, and medial edge of the transverse process of L3. Under fluoroscopic guidance, a Radiofrequency needle was inserted until contact was made with os over the superior postero-lateral aspect of the pedicular shadow (target area). Sensory and motor testing was conducted to properly adjust the position of the needle. Once satisfactory placement of the needle was achieved, the numbing solution was slowly injected after negative aspiration for blood. 2.0 mL of the nerve block solution was injected without difficulty or complication. After waiting for at least 3 minutes, the ablation was performed. Once completed, the needle was removed intact. L3 Medial Branch Nerve RFA: The target area for the L3 medial branch is at the junction of the postero-lateral aspect of the superior articular process and the superior, posterior, and medial edge of the transverse process of L4. Under fluoroscopic guidance, a Radiofrequency needle was inserted until contact was made with os over the superior postero-lateral aspect of the pedicular shadow (target area). Sensory and motor testing was conducted to properly adjust the position of the needle. Once satisfactory placement of the needle was achieved, the numbing solution was slowly injected after negative aspiration for blood. 2.0 mL of the nerve block solution was injected without difficulty or complication. After waiting for at least 3 minutes, the ablation was performed. Once completed, the needle was removed intact. L4 Medial Branch Nerve RFA: The target area for the L4 medial branch is at the junction of the postero-lateral aspect of the superior articular process and the superior, posterior, and medial edge of the transverse process of L5. Under fluoroscopic guidance, a Radiofrequency needle was inserted until contact was made  with os over the superior postero-lateral aspect of the pedicular shadow (target area). Sensory and motor testing was conducted to properly adjust the position of the needle. Once satisfactory placement of the needle was achieved, the numbing solution was slowly injected after negative aspiration for blood. 2.0 mL of the nerve block solution was injected without  difficulty or complication. After waiting for at least 3 minutes, the ablation was performed. Once completed, the needle was removed intact. L5 Medial Branch Nerve RFA: The target area for the L5 medial branch is at the junction of the postero-lateral aspect of the superior articular process of S1 and the superior, posterior, and medial edge of the sacral ala. Under fluoroscopic guidance, a Radiofrequency needle was inserted until contact was made with os over the superior postero-lateral aspect of the pedicular shadow (target area). Sensory and motor testing was conducted to properly adjust the position of the needle. Once satisfactory placement of the needle was achieved, the numbing solution was slowly injected after negative aspiration for blood. 2.0 mL of the nerve block solution was injected without difficulty or complication. After waiting for at least 3 minutes, the ablation was performed. Once completed, the needle was removed intact. S1 Medial Branch Nerve RFA: The target area for the S1 medial branch is located inferior to the junction of the S1 superior articular process and the L5 inferior articular process, posterior, inferior, and lateral to the 6 o'clock position of the L5-S1 facet joint, just superior to the S1 posterior foramen. Under fluoroscopic guidance, the Radiofrequency needle was advanced until contact was made with os over the Target area. Sensory and motor testing was conducted to properly adjust the position of the needle. Once satisfactory placement of the needle was achieved, the numbing solution was slowly injected after  negative aspiration for blood. 2.0 mL of the nerve block solution was injected without difficulty or complication. After waiting for at least 3 minutes, the ablation was performed. Once completed, the needle was removed intact. Radiofrequency lesioning (ablation):  Radiofrequency Generator: NeuroTherm [id number] Sensory Stimulation Parameters: 50 Hz was used to locate & identify the nerve, making sure that the needle was positioned such that there was no sensory stimulation below 0.3 V or above 0.7 V. Motor Stimulation Parameters: 2 Hz was used to evaluate the motor component. Care was taken not to lesion any nerves that demonstrated motor stimulation of the lower extremities at an output of less than 2.5 times that of the sensory threshold, or a maximum of 2.0 V. Lesioning Technique Parameters: Standard Radiofrequency settings. (Not bipolar or pulsed.) Temperature Settings: 80 degrees C Lesioning time: 60 seconds Intra-operative Compliance: Compliant Materials & Medications: Needle(s) (Electrode/Cannula) Type: Teflon-coated, curved tip, Radiofrequency needle(s) Gauge: 22G Length: 10cm Numbing solution: 0.2% PF-Ropivacaine + Triamcinolone (40 mg/mL) diluted to a final concentration of 4 mg of Triamcinolone/mL of Ropivacaine The unused portion of the solution was discarded in the proper designated containers.  Once the entire procedure was completed, the treated area was cleaned, making sure to leave some of the prepping solution back to take advantage of its long term bactericidal properties.  Illustration of the posterior view of the lumbar spine and the posterior neural structures. Laminae of L2 through S1 are labeled. DPRL5, dorsal primary ramus of L5; DPRS1, dorsal primary ramus of S1; DPR3, dorsal primary ramus of L3; FJ, facet (zygapophyseal) joint L3-L4; I, inferior articular process of L4; LB1, lateral branch of dorsal primary ramus of L1; IAB, inferior articular branches from L3 medial branch  (supplies L4-L5 facet joint); IBP, intermediate branch plexus; MB3, medial branch of dorsal primary ramus of L3; NR3, third lumbar nerve root; S, superior articular process of L5; SAB, superior articular branches from L4 (supplies L4-5 facet joint also); TP3, transverse process of L3.  Vitals:   12/02/19 1158 12/02/19 1208 12/02/19 1218 12/02/19 1229  BP: 122/82 120/83 124/82 124/80  Pulse:      Resp: 16 14 14 14   Temp:  98.5 F (36.9 C)  98.2 F (36.8 C)  SpO2: 99% 99% 99% 99%  Weight:      Height:       Start Time: 1118 hrs. End Time: 1158 hrs.  Imaging Guidance (Spinal):          Type of Imaging Technique: Fluoroscopy Guidance (Spinal) Indication(s): Assistance in needle guidance and placement for procedures requiring needle placement in or near specific anatomical locations not easily accessible without such assistance. Exposure Time: Please see nurses notes. Contrast: None used. Fluoroscopic Guidance: I was personally present during the use of fluoroscopy. "Tunnel Vision Technique" used to obtain the best possible view of the target area. Parallax error corrected before commencing the procedure. "Direction-depth-direction" technique used to introduce the needle under continuous pulsed fluoroscopy. Once target was reached, antero-posterior, oblique, and lateral fluoroscopic projection used confirm needle placement in all planes. Images permanently stored in EMR. Interpretation: No contrast injected. I personally interpreted the imaging intraoperatively. Adequate needle placement confirmed in multiple planes. Permanent images saved into the patient's record.  Antibiotic Prophylaxis:   Anti-infectives (From admission, onward)   None     Indication(s): None identified  Post-operative Assessment:  Post-procedure Vital Signs:  Pulse/HCG Rate: 10083 Temp: 98.2 F (36.8 C) Resp: 14 BP: 124/80 SpO2: 99 %  EBL: None  Complications: No immediate post-treatment complications  observed by team, or reported by patient.  Note: The patient tolerated the entire procedure well. A repeat set of vitals were taken after the procedure and the patient was kept under observation following institutional policy, for this type of procedure. Post-procedural neurological assessment was performed, showing return to baseline, prior to discharge. The patient was provided with post-procedure discharge instructions, including a section on how to identify potential problems. Should any problems arise concerning this procedure, the patient was given instructions to immediately contact , at any time, without hesitation. In any case, we plan to contact the patient by telephone for a follow-up status report regarding this interventional procedure.  Comments:  No additional relevant information.  Plan of Care  Orders:  Orders Placed This Encounter  Procedures  . Radiofrequency,Lumbar    Scheduling Instructions:     Side(s): Left-sided     Level: L3-4, L4-5, & L5-S1 Facets (L2, L3, L4, L5, & S1 Medial Branch Nerves)     Sedation: Patient's choice.     Timeframe: Today    Order Specific Question:   Where will this procedure be performed?    Answer:   [facility]  . Radiofrequency,Lumbar    Standing Status:   Future    Standing Expiration Date:   12/01/2020    Scheduling Instructions:     Side(s): Right-sided     Level: L3-4, L4-5, & L5-S1 Facets (L2, L3, L4, L5, & S1 Medial Branch Nerves)     Sedation: With Sedation.     Scheduling Timeframe: 2 weeks from now    Order Specific Question:   Where will this procedure be performed?    Answer:   [facility]  . DG [facility] C-ARM 1-60 MIN NO REPORT    Intraoperative interpretation by procedural physician at [facility].    Standing Status:   Standing    Number of Occurrences:   1    Order Specific Question:   Reason for exam:    Answer:   Assistance in needle  guidance and placement for procedures requiring  needle placement in or near specific anatomical locations not easily accessible without such assistance.  . Informed Consent Details: Physician/Practitioner Attestation; Transcribe to consent form and obtain patient signature    Nursing Order: Transcribe to consent form and obtain patient signature. Note: Always confirm laterality of pain with Ms. Noteboom, before procedure. Procedure: Lumbar Facet Radiofrequency Ablation Indication/Reason: Low Back Pain, with our without leg pain, due to Facet Joint Arthralgia (Joint Pain) known as Lumbar Facet Syndrome, secondary to Lumbar, and/or Lumbosacral Spondylosis (Arthritis of the Spine), without myelopathy or radiculopathy (Nerve Damage). Provider Attestation: I, Lovina Zuver A. Laban Emperor, MD, (Pain Management Specialist), the physician/practitioner, attest that I have discussed with the patient the benefits, risks, side effects, alternatives, likelihood of achieving goals and potential problems during recovery for the procedure that I have provided informed consent.  . Provide equipment / supplies at bedside    Equipment required: Sterile "Radiofrequency Tray"; Large hemostat (1); Small hemostat (1); Towels (6-8); 4x4 sterile sponge pack (1) Radiofrequency Needle(s): Size: Regular Quantity: 5    Standing Status:   Standing    Number of Occurrences:   1    Order Specific Question:   Specify    Answer:   Radiofrequency Tray   Chronic Opioid Analgesic:  None Highest recorded MME/day: 25 mg/day MME/day: 0 mg/day   Medications ordered for procedure: Meds ordered this encounter  Medications  . lidocaine (XYLOCAINE) 2 % (with pres) injection 400 mg  . lactated ringers infusion 1,000 mL  . midazolam (VERSED) 5 MG/5ML injection 1-2 mg    Make sure Flumazenil is available in the pyxis when using this medication. If oversedation occurs, administer 0.2 mg IV over 15 sec. If after 45 sec no response, administer 0.2 mg again over 1 min; may repeat at 1 min intervals;  not to exceed 4 doses (1 mg)  . fentaNYL (SUBLIMAZE) injection 25-50 mcg    Make sure Narcan is available in the pyxis when using this medication. In the event of respiratory depression (RR< 8/min): Titrate NARCAN (naloxone) in increments of 0.1 to 0.2 mg IV at 2-3 minute intervals, until desired degree of reversal.  . ropivacaine (PF) 2 mg/mL (0.2%) (NAROPIN) injection 9 mL  . triamcinolone acetonide (KENALOG-40) injection 40 mg  . HYDROcodone-acetaminophen (NORCO/VICODIN) 5-325 MG tablet    Sig: Take 1 tablet by mouth every 6 (six) hours as needed for up to 7 days for severe pain. Must last 7 days.    Dispense:  28 tablet    Refill:  0    For acute post-operative pain. Not to be refilled. Must last 7 days.  Marland Kitchen HYDROcodone-acetaminophen (NORCO/VICODIN) 5-325 MG tablet    Sig: Take 1 tablet by mouth every 6 (six) hours as needed for up to 7 days for severe pain. Must last 7 days.    Dispense:  28 tablet    Refill:  0    For acute post-operative pain. Not to be refilled.  Must last 7 days.   Medications administered: We administered lidocaine, lactated ringers, midazolam, fentaNYL, ropivacaine (PF) 2 mg/mL (0.2%), and triamcinolone acetonide.  See the medical record for exact dosing, route, and time of administration.  Follow-up plan:   Return in about 2 weeks (around 12/16/2019) for RFA (w/ sedation): (R) L-FCT RFA #1.       Interventional treatment options: Planned, scheduled, and/or pending:   Therapeutic left-sided lumbar facet RFA #1    Under consideration:   Diagnostic bilateral lumbar facet block #  2  Therapeutic bilateral intra-articular L4-5 facet injection  Possible bilateral lumbar facet RFA  Diagnostic left SI joint block  Possible left SI joint RFA  Diagnostic right-sided L2-3 LESI  Diagnostic right-sided L3-4 LESI  Diagnostic right-sided L4-5 LESI  Diagnostic bilateral L1 TFESI  Diagnostic bilateral L2 TFESI  Diagnostic bilateral L3 TFESI  Diagnostic bilateral L4  TFESI  Diagnostic bilateral L5 TFESI    Therapeutic/palliative (PRN):   None at this time     Recent Visits Date Type Provider Dept  11/15/19 Telemedicine Delano Metz, MD [facility]  10/28/19 Procedure visit Delano Metz, MD [facility]  10/14/19 Telemedicine Delano Metz, MD [facility]  09/21/19 Procedure visit Delano Metz, MD [facility]  Showing recent visits within past 90 days and meeting all other requirements Today's Visits Date Type Provider Dept  12/02/19 Procedure visit Delano Metz, MD [facility]  Showing today's visits and meeting all other requirements Future Appointments Date Type Provider Dept  12/16/19 Appointment Delano Metz, MD [facility]  Showing future appointments within next 90 days and meeting all other requirements  Disposition: Discharge home  Discharge (Date  Time): 12/02/2019; 1230 hrs.   Primary Care Physician: Leda Quail, PA Location: [facility] Note by: Oswaldo Done, MD Date: 12/02/2019; Time: 12:47 PM  Disclaimer:  Medicine is not an Visual merchandiser. The only guarantee in medicine is that nothing is guaranteed. It is important to note that the decision to proceed with this intervention was based on the information collected from the patient. The Data and conclusions were drawn from the patient's questionnaire, the interview, and the physical examination. Because the information was provided in large part by the patient, it cannot be guaranteed that it has not been purposely or unconsciously manipulated. Every effort has been made to obtain as much relevant data as possible for this evaluation. It is important to note that the conclusions that lead to this procedure are derived in large part from the available data. Always take into account that the treatment will also be dependent on availability of resources and  existing treatment guidelines, considered by other Pain Management Practitioners as being common knowledge and practice, at the time of the intervention. For Medico-Legal purposes, it is also important to point out that variation in procedural techniques and pharmacological choices are the acceptable norm. The indications, contraindications, technique, and results of the above procedure should only be interpreted and judged by a Board-Certified Interventional Pain Specialist with extensive familiarity and expertise in the same exact procedure and technique.

## 2019-12-02 NOTE — Progress Notes (Signed)
Safety precautions to be maintained throughout the outpatient stay will include: orient to surroundings, keep bed in low position, maintain call bell within reach at all times, provide assistance with transfer out of bed and ambulation.  

## 2019-12-02 NOTE — Patient Instructions (Addendum)
___________________________________________________________________________________________  Post-Radiofrequency (RF) Discharge Instructions  You have just completed a Radiofrequency Neurotomy.  The following instructions will provide you with information and guidelines for self-care upon discharge.  If at any time you have questions or concerns please call your physician. DO NOT DRIVE YOURSELF!!  Instructions:  Apply ice: Fill a plastic sandwich bag with crushed ice. Cover it with a small towel and apply to injection site. Apply for 15 minutes then remove x 15 minutes. Repeat sequence on day of procedure, until you go to bed. The purpose is to minimize swelling and discomfort after procedure.  Apply heat: Apply heat to procedure site starting the day following the procedure. The purpose is to treat any soreness and discomfort from the procedure.  Food intake: No eating limitations, unless stipulated above.  Nevertheless, if you have had sedation, you may experience some nausea.  In this case, it may be wise to wait at least two hours prior to resuming regular diet.  Physical activities: Keep activities to a minimum for the first 8 hours after the procedure. For the first 24 hours after the procedure, do not drive a motor vehicle,  Operate heavy machinery, power tools, or handle any weapons.  Consider walking with the use of an assistive device or accompanied by an adult for the first 24 hours.  Do not drink alcoholic beverages including beer.  Do not make any important decisions or sign any legal documents. Go home and rest today.  Resume activities tomorrow, as tolerated.  Use caution in moving about as you may experience mild leg weakness.  Use caution in cooking, use of household electrical appliances and climbing steps.  Driving: If you have received any sedation, you are not allowed to drive for 24 hours after your procedure.  Blood thinner: Restart your blood thinner 6 hours after your  procedure. (Only for those taking blood thinners)  Insulin: As soon as you can eat, you may resume your normal dosing schedule. (Only for those taking insulin)  Medications: May resume pre-procedure medications.  Do not take any drugs, other than what has been prescribed to you.  Infection prevention: Keep procedure site clean and dry.  Post-procedure Pain Diary: Extremely important that this be done correctly and accurately. Recorded information will be used to determine the next step in treatment.  Pain evaluated is that of treated area only. Do not include pain from an untreated area.  Complete every hour, on the hour, for the initial 8 hours. Set an alarm to help you do this part accurately.  Do not go to sleep and have it completed later. It will not be accurate.  Follow-up appointment: Keep your follow-up appointment after the procedure. Usually 2-6 weeks after radiofrequency. Bring you pain diary. The information collected will be essential for your long-term care.   Expect:  From numbing medicine (AKA: Local Anesthetics): Numbness or decrease in pain.  Onset: Full effect within 15 minutes of injected.  Duration: It will depend on the type of local anesthetic used. On the average, 1 to 8 hours.   From steroids (when added): Decrease in swelling or inflammation. Once inflammation is improved, relief of the pain will follow.  Onset of benefits: Depends on the amount of swelling present. The more swelling, the longer it will take for the benefits to be seen. In some cases, up to 10 days.  Duration: Steroids will stay in the system x 2 weeks. Duration of benefits will depend on multiple posibilities including persistent irritating factors.    From procedure: Some discomfort is to be expected once the numbing medicine wears off. In the case of radiofrequency procedures, this may last as long as 6 weeks. Additional post-procedure pain medication is provided for this. Discomfort is  minimized if ice and heat are applied as instructed.  Call if:  You experience numbness and weakness that gets worse with time, as opposed to wearing off.  He experience any unusual bleeding, difficulty breathing, or loss of the ability to control your bowel and bladder. (This applies to Spinal procedures only)  You experience any redness, swelling, heat, red streaks, elevated temperature, fever, or any other signs of a possible infection.  Emergency Numbers:  Durning business hours (Monday - Thursday, 8:00 AM - 4:00 PM) (Friday, 9:00 AM - 12:00 Noon): [phone]  After hours: [phone] ____________________________________________________________________________________________   ____________________________________________________________________________________________  Preparing for Procedure with Sedation  Procedure appointments are limited to planned procedures: . No Prescription Refills. . No disability issues will be discussed. . No medication changes will be discussed.  Instructions: . Oral Intake: Do not eat or drink anything for at least 8 hours prior to your procedure. (Exception: Blood Pressure Medication. See below.) . Transportation: Unless otherwise stated by your physician, you may drive yourself after the procedure. . Blood Pressure Medicine: Do not forget to take your blood pressure medicine with a sip of water the morning of the procedure. If your Diastolic (lower reading)is above 100 mmHg, elective cases will be cancelled/rescheduled. . Blood thinners: These will need to be stopped for procedures. Notify our staff if you are taking any blood thinners. Depending on which one you take, there will be specific instructions on how and when to stop it. . Diabetics on insulin: Notify the staff so that you can be scheduled 1st case in the morning. If your diabetes requires high dose insulin, take only  of your normal insulin dose the morning of the procedure and  notify the staff that you have done so. . Preventing infections: Shower with an antibacterial soap the morning of your procedure. . Build-up your immune system: Take 1000 mg of Vitamin C with every meal (3 times a day) the day prior to your procedure. . Antibiotics: Inform the staff if you have a condition or reason that requires you to take antibiotics before dental procedures. . Pregnancy: If you are pregnant, call and cancel the procedure. . Sickness: If you have a cold, fever, or any active infections, call and cancel the procedure. . Arrival: You must be in the facility at least 30 minutes prior to your scheduled procedure. . Children: Do not bring children with you. . Dress appropriately: Bring dark clothing that you would not mind if they get stained. . Valuables: Do not bring any jewelry or valuables.  Reasons to call and reschedule or cancel your procedure: (Following these recommendations will minimize the risk of a serious complication.) . Surgeries: Avoid having procedures within 2 weeks of any surgery. (Avoid for 2 weeks before or after any surgery). . Flu Shots: Avoid having procedures within 2 weeks of a flu shots or . (Avoid for 2 weeks before or after immunizations). . Barium: Avoid having a procedure within 7-10 days after having had a radiological study involving the use of radiological contrast. (Myelograms, Barium swallow or enema study). . Heart attacks: Avoid any elective procedures or surgeries for the initial 6 months after a "Myocardial Infarction" (Heart Attack). . Blood thinners: It is imperative that you stop these medications before procedures. Let   us know if you if you take any blood thinner.  . Infection: Avoid procedures during or within two weeks of an infection (including chest colds or gastrointestinal problems). Symptoms associated with infections include: Localized redness, fever, chills, night sweats or profuse sweating, burning sensation when voiding, cough,  congestion, stuffiness, runny nose, sore throat, diarrhea, nausea, vomiting, cold or Flu symptoms, recent or current infections. It is specially important if the infection is over the area that we intend to treat. . Heart and lung problems: Symptoms that may suggest an active cardiopulmonary problem include: cough, chest pain, breathing difficulties or shortness of breath, dizziness, ankle swelling, uncontrolled high or unusually low blood pressure, and/or palpitations. If you are experiencing any of these symptoms, cancel your procedure and contact your primary care physician for an evaluation.  Remember:  Regular Business hours are:  Monday to Thursday 8:00 AM to 4:00 PM  Provider's Schedule: Markus Casten, MD:  Procedure days: Tuesday and Thursday 7:30 AM to 4:00 PM  Bilal Lateef, MD:  Procedure days: Monday and Wednesday 7:30 AM to 4:00 PM ____________________________________________________________________________________________    

## 2019-12-03 ENCOUNTER — Telehealth: Payer: Self-pay

## 2019-12-03 NOTE — Telephone Encounter (Signed)
Post procedure phone call. Patient states she is doing good.  

## 2019-12-13 ENCOUNTER — Other Ambulatory Visit: Payer: Self-pay | Admitting: Family Medicine

## 2019-12-13 DIAGNOSIS — Z1231 Encounter for screening mammogram for malignant neoplasm of breast: Secondary | ICD-10-CM

## 2019-12-16 ENCOUNTER — Ambulatory Visit: Payer: Managed Care, Other (non HMO) | Admitting: Pain Medicine

## 2019-12-30 ENCOUNTER — Encounter: Payer: Self-pay | Admitting: Pain Medicine

## 2019-12-30 ENCOUNTER — Ambulatory Visit (HOSPITAL_BASED_OUTPATIENT_CLINIC_OR_DEPARTMENT_OTHER): Payer: Managed Care, Other (non HMO) | Admitting: Pain Medicine

## 2019-12-30 ENCOUNTER — Ambulatory Visit
Admission: RE | Admit: 2019-12-30 | Discharge: 2019-12-30 | Disposition: A | Discharge status: Home / Self Care | Payer: Managed Care, Other (non HMO) | Source: Ambulatory Visit | Attending: Pain Medicine | Admitting: Pain Medicine

## 2019-12-30 ENCOUNTER — Other Ambulatory Visit: Payer: Self-pay

## 2019-12-30 VITALS — BP 100/64 | HR 93 | Temp 97.2°F | Resp 20 | Ht 64.0 in | Wt 200.0 lb

## 2019-12-30 DIAGNOSIS — M545 Low back pain: Secondary | ICD-10-CM

## 2019-12-30 DIAGNOSIS — M47817 Spondylosis without myelopathy or radiculopathy, lumbosacral region: Secondary | ICD-10-CM | POA: Diagnosis not present

## 2019-12-30 DIAGNOSIS — M5137 Other intervertebral disc degeneration, lumbosacral region: Secondary | ICD-10-CM

## 2019-12-30 DIAGNOSIS — M47816 Spondylosis without myelopathy or radiculopathy, lumbar region: Secondary | ICD-10-CM | POA: Insufficient documentation

## 2019-12-30 DIAGNOSIS — G8929 Other chronic pain: Secondary | ICD-10-CM | POA: Diagnosis present

## 2019-12-30 DIAGNOSIS — G8918 Other acute postprocedural pain: Secondary | ICD-10-CM | POA: Insufficient documentation

## 2019-12-30 DIAGNOSIS — M4310 Spondylolisthesis, site unspecified: Secondary | ICD-10-CM | POA: Diagnosis present

## 2019-12-30 MED ORDER — FENTANYL CITRATE (PF) 100 MCG/2ML IJ SOLN
25.0000 ug | INTRAMUSCULAR | Status: DC | PRN
  Administered 2019-12-30: 100 ug via INTRAVENOUS

## 2019-12-30 MED ORDER — LIDOCAINE HCL 2 % IJ SOLN
20.0000 mL | Freq: Once | INTRAMUSCULAR | Status: AC
  Administered 2019-12-30: 200 mg

## 2019-12-30 MED ORDER — MIDAZOLAM HCL 5 MG/5ML IJ SOLN
INTRAMUSCULAR | Status: AC
  Filled 2019-12-30: qty 5

## 2019-12-30 MED ORDER — LIDOCAINE HCL 2 % IJ SOLN
INTRAMUSCULAR | Status: AC
  Filled 2019-12-30: qty 10

## 2019-12-30 MED ORDER — ROPIVACAINE HCL 2 MG/ML IJ SOLN
9.0000 mL | Freq: Once | INTRAMUSCULAR | Status: AC
  Administered 2019-12-30: 10 mL via PERINEURAL

## 2019-12-30 MED ORDER — FENTANYL CITRATE (PF) 100 MCG/2ML IJ SOLN
INTRAMUSCULAR | Status: AC
  Filled 2019-12-30: qty 2

## 2019-12-30 MED ORDER — MIDAZOLAM HCL 5 MG/5ML IJ SOLN
1.0000 mg | INTRAMUSCULAR | Status: DC | PRN
  Administered 2019-12-30: 5 mg via INTRAVENOUS

## 2019-12-30 MED ORDER — TRIAMCINOLONE ACETONIDE 40 MG/ML IJ SUSP
40.0000 mg | Freq: Once | INTRAMUSCULAR | Status: AC
  Administered 2019-12-30: 40 mg

## 2019-12-30 MED ORDER — HYDROCODONE-ACETAMINOPHEN 5-325 MG PO TABS: 1.0000 | ORAL_TABLET | Freq: Four times a day (QID) | ORAL | 0 refills | Status: AC | PRN

## 2019-12-30 MED ORDER — LACTATED RINGERS IV SOLN
1000.0000 mL | Freq: Once | INTRAVENOUS | Status: AC
  Administered 2019-12-30: 1000 mL via INTRAVENOUS

## 2019-12-30 MED ORDER — ROPIVACAINE HCL 2 MG/ML IJ SOLN
INTRAMUSCULAR | Status: AC
  Filled 2019-12-30: qty 10

## 2019-12-30 MED ORDER — TRIAMCINOLONE ACETONIDE 40 MG/ML IJ SUSP
INTRAMUSCULAR | Status: AC
  Filled 2019-12-30: qty 1

## 2019-12-30 NOTE — Patient Instructions (Signed)

## 2019-12-30 NOTE — Progress Notes (Signed)
PROVIDER NOTE: Information contained herein reflects review and annotations entered in association with encounter. Interpretation of such information and data should be left to medically-trained personnel. Information provided to patient can be located elsewhere in the medical record under "Patient Instructions". Document created using STT-dictation technology, any transcriptional errors that may result from process are unintentional.    Patient: Tonya Robertson  Service Category: Procedure  Provider: Oswaldo Done, MD  DOB: 09-Feb-1966  DOS: 12/30/2019  Location: [facility]  MRN: [id number]  Setting: Ambulatory - outpatient  Referring Provider: Leda Quail, PA  Type: Established Patient  Specialty: Interventional Pain Management  PCP: Leda Quail, PA   Primary Reason for Visit: Interventional Pain Management Treatment. CC: Back Pain (low)  Procedure:          Anesthesia, Analgesia, Anxiolysis:  Type: Thermal Lumbar Facet, Medial Branch Radiofrequency Ablation/Neurotomy           Primary Purpose: Therapeutic Region: Posterolateral Lumbosacral Spine Level: L2, L3, L4, L5, & S1 Medial Branch Level(s). These levels will denervate the L3-4, L4-5, and the L5-S1 lumbar facet joints. Laterality: Right  Type: Moderate (Conscious) Sedation combined with Local Anesthesia Indication(s): Analgesia and Anxiety Route: Intravenous (IV) IV Access: Secured Sedation: Meaningful verbal contact was maintained at all times during the procedure  Local Anesthetic: Lidocaine 1-2%  Position: Prone   Indications: 1. Lumbar facet syndrome (Bilateral) (R>L)   2. Spondylosis without myelopathy or radiculopathy, lumbosacral region   3. Grade 1 Anterolisthesis of L4 over L5   4. DDD (degenerative disc disease), lumbosacral   5. Lumbosacral facet arthropathy (Multilevel) (Bilateral)   6. Chronic low back pain (1ry area of Pain) (Bilateral) (R>L) w/o sciatica    Tonya Robertson has been dealing with  the above chronic pain for longer than three months and has either failed to respond, was unable to tolerate, or simply did not get enough benefit from other more conservative therapies including, but not limited to: 1. Over-the-counter medications 2. Anti-inflammatory medications 3. Muscle relaxants 4. Membrane stabilizers 5. Opioids 6. Physical therapy and/or chiropractic manipulation 7. Modalities (Heat, ice, etc.) 8. Invasive techniques such as nerve blocks. Tonya Robertson has attained more than 50% relief of the pain from a series of diagnostic injections conducted in separate occasions.  Pain Score: Pre-procedure: 0-No pain/10 Post-procedure: 0-No pain/10  Post-Procedure Evaluation  Procedure (12/02/2019): Therapeutic left lumbar facet RFA #1 under fluoroscopic guidance and IV sedation Pre-procedure pain level: 0/10 Post-procedure: 0/10          Sedation: Sedation provided.  Effectiveness during initial hour after procedure(Ultra-Short Term Relief): 100 %.  Local anesthetic used: Long-acting (4-6 hours) Effectiveness: Defined as any analgesic benefit obtained secondary to the administration of local anesthetics. This carries significant diagnostic value as to the etiological location, or anatomical origin, of the pain. Duration of benefit is expected to coincide with the duration of the local anesthetic used.  Effectiveness during initial 4-6 hours after procedure(Short-Term Relief): 100 %.  Long-term benefit: Defined as any relief past the pharmacologic duration of the local anesthetics.  Effectiveness past the initial 6 hours after procedure(Long-Term Relief): 100 % (has only been 4 weeks).  Current benefits: Defined as benefit that persist at this time.   Analgesia:  90-100% better Function: Tonya Robertson reports improvement in function ROM: Tonya Robertson reports improvement in ROM  Pre-op Assessment:  Tonya Robertson is a 54 y.o. (year old), female patient, seen today for interventional  treatment. She  has a past surgical history that includes  Abdominal hysterectomy (2008). Tonya Robertson has a current medication list which includes the following prescription(s): acetaminophen, albuterol, alprazolam, bupropion, vitamin b-12, duloxetine, gabapentin, ibuprofen, olmesartan-amlodipine-hctz, saxenda, wegovy, hydrocodone-acetaminophen, [START ON 01/06/2020] hydrocodone-acetaminophen, and indomethacin, and the following Facility-Administered Medications: fentanyl and midazolam. Her primarily concern today is the Back Pain (low)  Initial Vital Signs:  Pulse/HCG Rate: 93ECG Heart Rate: 94 (nsr) Temp: (!) 97.1 F (36.2 C) Resp: 16 BP: 123/84 SpO2: 100 %  BMI: Estimated body mass index is 34.33 kg/m as calculated from the following:   Height as of this encounter:  (1.626 m).   Weight as of this encounter: 200 lb (90.7 kg).  Risk Assessment: Allergies: Reviewed. She has No Known Allergies.  Allergy Precautions: None required Coagulopathies: Reviewed. None identified.  Blood-thinner therapy: None at this time Active Infection(s): Reviewed. None identified. Tonya Robertson is afebrile  Site Confirmation: Tonya Robertson was asked to confirm the procedure and laterality before marking the site Procedure checklist: Completed Consent: Before the procedure and under the influence of no sedative(s), amnesic(s), or anxiolytics, the patient was informed of the treatment options, risks and possible complications. To fulfill our ethical and legal obligations, as recommended by the American Medical Association's Code of Ethics, I have informed the patient of my clinical impression; the nature and purpose of the treatment or procedure; the risks, benefits, and possible complications of the intervention; the alternatives, including doing nothing; the risk(s) and benefit(s) of the alternative treatment(s) or procedure(s); and the risk(s) and benefit(s) of doing nothing. The patient was provided information about the  general risks and possible complications associated with the procedure. These may include, but are not limited to: failure to achieve desired goals, infection, bleeding, organ or nerve damage, allergic reactions, paralysis, and death. In addition, the patient was informed of those risks and complications associated to Spine-related procedures, such as failure to decrease pain; infection (i.e.: Meningitis, epidural or intraspinal abscess); bleeding (i.e.: epidural hematoma, subarachnoid hemorrhage, or any other type of intraspinal or peri-dural bleeding); organ or nerve damage (i.e.: Any type of peripheral nerve, nerve root, or spinal cord injury) with subsequent damage to sensory, motor, and/or autonomic systems, resulting in permanent pain, numbness, and/or weakness of one or several areas of the body; allergic reactions; (i.e.: anaphylactic reaction); and/or death. Furthermore, the patient was informed of those risks and complications associated with the medications. These include, but are not limited to: allergic reactions (i.e.: anaphylactic or anaphylactoid reaction(s)); adrenal axis suppression; blood sugar elevation that in diabetics may result in ketoacidosis or comma; water retention that in patients with history of congestive heart failure may result in shortness of breath, pulmonary edema, and decompensation with resultant heart failure; weight gain; swelling or edema; medication-induced neural toxicity; particulate matter embolism and blood vessel occlusion with resultant organ, and/or nervous system infarction; and/or aseptic necrosis of one or more joints. Finally, the patient was informed that Medicine is not an exact science; therefore, there is also the possibility of unforeseen or unpredictable risks and/or possible complications that may result in a catastrophic outcome. The patient indicated having understood very clearly. We have given the patient no guarantees and we have made no promises.  Enough time was given to the patient to ask questions, all of which were answered to the patient's satisfaction. Ms. Brassfield has indicated that she wanted to continue with the procedure. Attestation: I, the ordering provider, attest that I have discussed with the patient the benefits, risks, side-effects, alternatives, likelihood of achieving goals, and potential problems during  recovery for the procedure that I have provided informed consent. Date  Time: 12/30/2019  8:13 AM  Pre-Procedure Preparation:  Monitoring: As per clinic protocol. Respiration, ETCO2, SpO2, BP, heart rate and rhythm monitor placed and checked for adequate function Safety Precautions: Patient was assessed for positional comfort and pressure points before starting the procedure. Time-out: I initiated and conducted the "Time-out" before starting the procedure, as per protocol. The patient was asked to participate by confirming the accuracy of the "Time Out" information. Verification of the correct person, site, and procedure were performed and confirmed by me, the nursing staff, and the patient. "Time-out" conducted as per Joint Commission's Universal Protocol (UP.01.01.01). Time: 0848  Description of Procedure:          Laterality: Right Levels:  L2, L3, L4, L5, & S1 Medial Branch Level(s), at the L3-4, L4-5, and the L5-S1 lumbar facet joints. Area Prepped: Lumbosacral DuraPrep (Iodine Povacrylex [0.7% available iodine] and Isopropyl Alcohol, 74% w/w) Safety Precautions: Aspiration looking for blood return was conducted prior to all injections. At no point did we inject any substances, as a needle was being advanced. Before injecting, the patient was told to immediately notify me if she was experiencing any new onset of "ringing in the ears, or metallic taste in the mouth". No attempts were made at seeking any paresthesias. Safe injection practices and needle disposal techniques used. Medications properly checked for expiration  dates. SDV (single dose vial) medications used. After the completion of the procedure, all disposable equipment used was discarded in the proper designated medical waste containers. Local Anesthesia: Protocol guidelines were followed. The patient was positioned over the fluoroscopy table. The area was prepped in the usual manner. The time-out was completed. The target area was identified using fluoroscopy. A 12-in long, straight, sterile hemostat was used with fluoroscopic guidance to locate the targets for each level blocked. Once located, the skin was marked with an approved surgical skin marker. Once all sites were marked, the skin (epidermis, dermis, and hypodermis), as well as deeper tissues (fat, connective tissue and muscle) were infiltrated with a small amount of a short-acting local anesthetic, loaded on a 10cc syringe with a 25G, 1.5-in  Needle. An appropriate amount of time was allowed for local anesthetics to take effect before proceeding to the next step. Local Anesthetic: Lidocaine 2.0% The unused portion of the local anesthetic was discarded in the proper designated containers. Technical explanation of process:  Radiofrequency Ablation (RFA) L2 Medial Branch Nerve RFA: The target area for the L2 medial branch is at the junction of the postero-lateral aspect of the superior articular process and the superior, posterior, and medial edge of the transverse process of L3. Under fluoroscopic guidance, a Radiofrequency needle was inserted until contact was made with os over the superior postero-lateral aspect of the pedicular shadow (target area). Sensory and motor testing was conducted to properly adjust the position of the needle. Once satisfactory placement of the needle was achieved, the numbing solution was slowly injected after negative aspiration for blood. 2.0 mL of the nerve block solution was injected without difficulty or complication. After waiting for at least 3 minutes, the ablation was  performed. Once completed, the needle was removed intact. L3 Medial Branch Nerve RFA: The target area for the L3 medial branch is at the junction of the postero-lateral aspect of the superior articular process and the superior, posterior, and medial edge of the transverse process of L4. Under fluoroscopic guidance, a Radiofrequency needle was inserted until contact was  made with os over the superior postero-lateral aspect of the pedicular shadow (target area). Sensory and motor testing was conducted to properly adjust the position of the needle. Once satisfactory placement of the needle was achieved, the numbing solution was slowly injected after negative aspiration for blood. 2.0 mL of the nerve block solution was injected without difficulty or complication. After waiting for at least 3 minutes, the ablation was performed. Once completed, the needle was removed intact. L4 Medial Branch Nerve RFA: The target area for the L4 medial branch is at the junction of the postero-lateral aspect of the superior articular process and the superior, posterior, and medial edge of the transverse process of L5. Under fluoroscopic guidance, a Radiofrequency needle was inserted until contact was made with os over the superior postero-lateral aspect of the pedicular shadow (target area). Sensory and motor testing was conducted to properly adjust the position of the needle. Once satisfactory placement of the needle was achieved, the numbing solution was slowly injected after negative aspiration for blood. 2.0 mL of the nerve block solution was injected without difficulty or complication. After waiting for at least 3 minutes, the ablation was performed. Once completed, the needle was removed intact. L5 Medial Branch Nerve RFA: The target area for the L5 medial branch is at the junction of the postero-lateral aspect of the superior articular process of S1 and the superior, posterior, and medial edge of the sacral ala. Under  fluoroscopic guidance, a Radiofrequency needle was inserted until contact was made with os over the superior postero-lateral aspect of the pedicular shadow (target area). Sensory and motor testing was conducted to properly adjust the position of the needle. Once satisfactory placement of the needle was achieved, the numbing solution was slowly injected after negative aspiration for blood. 2.0 mL of the nerve block solution was injected without difficulty or complication. After waiting for at least 3 minutes, the ablation was performed. Once completed, the needle was removed intact. S1 Medial Branch Nerve RFA: The target area for the S1 medial branch is located inferior to the junction of the S1 superior articular process and the L5 inferior articular process, posterior, inferior, and lateral to the 6 o'clock position of the L5-S1 facet joint, just superior to the S1 posterior foramen. Under fluoroscopic guidance, the Radiofrequency needle was advanced until contact was made with os over the Target area. Sensory and motor testing was conducted to properly adjust the position of the needle. Once satisfactory placement of the needle was achieved, the numbing solution was slowly injected after negative aspiration for blood. 2.0 mL of the nerve block solution was injected without difficulty or complication. After waiting for at least 3 minutes, the ablation was performed. Once completed, the needle was removed intact. Radiofrequency lesioning (ablation):  Radiofrequency Generator: NeuroTherm [id number] Sensory Stimulation Parameters: 50 Hz was used to locate & identify the nerve, making sure that the needle was positioned such that there was no sensory stimulation below 0.3 V or above 0.7 V. Motor Stimulation Parameters: 2 Hz was used to evaluate the motor component. Care was taken not to lesion any nerves that demonstrated motor stimulation of the lower extremities at an output of less than 2.5 times that of the  sensory threshold, or a maximum of 2.0 V. Lesioning Technique Parameters: Standard Radiofrequency settings. (Not bipolar or pulsed.) Temperature Settings: 80 degrees C Lesioning time: 60 seconds Intra-operative Compliance: Today she moved quite a bit more than she did on the first procedure.  I had to ask the  patient what was going on since she was experiencing quite a bit of pain that we did not see during the first procedure that we did on the left side.  She indicated that lately she has been experiencing a lot of pain and muscle tightness on the right side and it is a lot more sensitive than the left. Materials & Medications: Needle(s) (Electrode/Cannula) Type: Teflon-coated, curved tip, Radiofrequency needle(s) Gauge: 20G Length: 15cm Numbing solution: 0.2% PF-Ropivacaine + Triamcinolone (40 mg/mL) diluted to a final concentration of 4 mg of Triamcinolone/mL of Ropivacaine The unused portion of the solution was discarded in the proper designated containers.  Once the entire procedure was completed, the treated area was cleaned, making sure to leave some of the prepping solution back to take advantage of its long term bactericidal properties.  Illustration of the posterior view of the lumbar spine and the posterior neural structures. Laminae of L2 through S1 are labeled. DPRL5, dorsal primary ramus of L5; DPRS1, dorsal primary ramus of S1; DPR3, dorsal primary ramus of L3; FJ, facet (zygapophyseal) joint L3-L4; I, inferior articular process of L4; LB1, lateral branch of dorsal primary ramus of L1; IAB, inferior articular branches from L3 medial branch (supplies L4-L5 facet joint); IBP, intermediate branch plexus; MB3, medial branch of dorsal primary ramus of L3; NR3, third lumbar nerve root; S, superior articular process of L5; SAB, superior articular branches from L4 (supplies L4-5 facet joint also); TP3, transverse process of L3.  Vitals:   12/30/19 0927 12/30/19 0937 12/30/19 0947 12/30/19  0957  BP: 100/85 (!) 89/57 (!) 89/56 100/64  Pulse:      Resp: 18 (!) 22 20 20   Temp:  (!) 97.2 F (36.2 C)  (!) 97.2 F (36.2 C)  TempSrc:      SpO2: 95% 100% 100% 100%  Weight:      Height:       Start Time: 0848 hrs. End Time: 0927 hrs.  Imaging Guidance (Spinal):          Type of Imaging Technique: Fluoroscopy Guidance (Spinal) Indication(s): Assistance in needle guidance and placement for procedures requiring needle placement in or near specific anatomical locations not easily accessible without such assistance. Exposure Time: Please see nurses notes. Contrast: None used. Fluoroscopic Guidance: I was personally present during the use of fluoroscopy. "Tunnel Vision Technique" used to obtain the best possible view of the target area. Parallax error corrected before commencing the procedure. "Direction-depth-direction" technique used to introduce the needle under continuous pulsed fluoroscopy. Once target was reached, antero-posterior, oblique, and lateral fluoroscopic projection used confirm needle placement in all planes. Images permanently stored in EMR. Interpretation: No contrast injected. I personally interpreted the imaging intraoperatively. Adequate needle placement confirmed in multiple planes. Permanent images saved into the patient's record.  Antibiotic Prophylaxis:   Anti-infectives (From admission, onward)   None     Indication(s): None identified  Post-operative Assessment:  Post-procedure Vital Signs:  Pulse/HCG Rate: 9385 Temp: (!) 97.2 F (36.2 C) Resp: 20 BP: 100/64 SpO2: 100 %  EBL: None  Complications: No immediate post-treatment complications observed by team, or reported by patient.  Note: The patient tolerated the entire procedure well. A repeat set of vitals were taken after the procedure and the patient was kept under observation following institutional policy, for this type of procedure. Post-procedural neurological assessment was performed,  showing return to baseline, prior to discharge. The patient was provided with post-procedure discharge instructions, including a section on how to identify potential problems. Should any  problems arise concerning this procedure, the patient was given instructions to immediately contact us, at any time, without hesitation. In any case, we plan to contact the patient by telephone for a follow-up status report regarding this interventional procedure.  Comments:  No additional relevant information.  Plan of Care  Orders:  Orders Placed This Encounter  Procedures  . Radiofrequency,Lumbar    Scheduling Instructions:     Side(s): Right-sided     Level: L3-4, L4-5, & L5-S1 Facets (L2, L3, L4, L5, & S1 Medial Branch Nerves)     Sedation: Patient's choice.     Timeframe: Today    Order Specific Question:   Where will this procedure be performed?    Answer:   [facility]  . DG PA[facility] C-ARM 1-60 MIN NO REPORT    Intraoperative interpretation by procedural physician at [facility].    Standing Status:   Standing    Number of Occurrences:   1    Order Specific Question:   Reason for exam:    Answer:   Assistance in needle guidance and placement for procedures requiring needle placement in or near specific anatomical locations not easily accessible without such assistance.  . Informed Consent Details: Physician/Practitioner Attestation; Transcribe to consent form and obtain patient signature    Nursing Order: Transcribe to consent form and obtain patient signature. Note: Always confirm laterality of pain with Ms. Armwood, before procedure. Procedure: Lumbar Facet Radiofrequency Ablation Indication/Reason: Low Back Pain, with our without leg pain, due to Facet Joint Arthralgia (Joint Pain) known as Lumbar Facet Syndrome, secondary to Lumbar, and/or Lumbosacral Spondylosis (Arthritis of the Spine), without myelopathy or radiculopathy (Nerve Damage). Provider Attestation: I, Amair Shrout  A. Laban Emperor, MD, (Pain Management Specialist), the physician/practitioner, attest that I have discussed with the patient the benefits, risks, side effects, alternatives, likelihood of achieving goals and potential problems during recovery for the procedure that I have provided informed consent.  . Provide equipment / supplies at bedside    Equipment required: Sterile "Radiofrequency Tray"; Large hemostat (1); Small hemostat (1); Towels (6-8); 4x4 sterile sponge pack (1) Radiofrequency Needle(s): Size: Regular Quantity: 5    Standing Status:   Standing    Number of Occurrences:   1    Order Specific Question:   Specify    Answer:   Radiofrequency Tray   Chronic Opioid Analgesic:  None Highest recorded MME/day: 25 mg/day MME/day: 0 mg/day   Medications ordered for procedure: Meds ordered this encounter  Medications  . lidocaine (XYLOCAINE) 2 % (with pres) injection 400 mg  . lactated ringers infusion 1,000 mL  . midazolam (VERSED) 5 MG/5ML injection 1-2 mg    Make sure Flumazenil is available in the pyxis when using this medication. If oversedation occurs, administer 0.2 mg IV over 15 sec. If after 45 sec no response, administer 0.2 mg again over 1 min; may repeat at 1 min intervals; not to exceed 4 doses (1 mg)  . fentaNYL (SUBLIMAZE) injection 25-50 mcg    Make sure Narcan is available in the pyxis when using this medication. In the event of respiratory depression (RR< 8/min): Titrate NARCAN (naloxone) in increments of 0.1 to 0.2 mg IV at 2-3 minute intervals, until desired degree of reversal.  . ropivacaine (PF) 2 mg/mL (0.2%) (NAROPIN) injection 9 mL  . triamcinolone acetonide (KENALOG-40) injection 40 mg  . HYDROcodone-acetaminophen (NORCO/VICODIN) 5-325 MG tablet    Sig: Take 1 tablet by mouth every 6 (six) hours as needed for up  to 7 days for severe pain. Must last 7 days.    Dispense:  28 tablet    Refill:  0    For acute post-operative pain. Not to be refilled. Must last 7 days.    Marland Kitchen HYDROcodone-acetaminophen (NORCO/VICODIN) 5-325 MG tablet    Sig: Take 1 tablet by mouth every 6 (six) hours as needed for up to 7 days for severe pain. Must last 7 days.    Dispense:  28 tablet    Refill:  0    For acute post-operative pain. Not to be refilled.  Must last 7 days.   Medications administered: We administered lidocaine, lactated ringers, midazolam, fentaNYL, ropivacaine (PF) 2 mg/mL (0.2%), and triamcinolone acetonide.  See the medical record for exact dosing, route, and time of administration.  Follow-up plan:   Return in about 6 weeks (around 02/10/2020) for F2F encounter, 20-min, PP-(on procedure day).       Interventional treatment options: Planned, scheduled, and/or pending:   Therapeutic left-sided lumbar facet RFA #1    Under consideration:   Diagnostic bilateral lumbar facet block #2  Therapeutic bilateral intra-articular L4-5 facet injection  Possible bilateral lumbar facet RFA  Diagnostic left SI joint block  Possible left SI joint RFA  Diagnostic right-sided L2-3 LESI  Diagnostic right-sided L3-4 LESI  Diagnostic right-sided L4-5 LESI  Diagnostic bilateral L1 TFESI  Diagnostic bilateral L2 TFESI  Diagnostic bilateral L3 TFESI  Diagnostic bilateral L4 TFESI  Diagnostic bilateral L5 TFESI    Therapeutic/palliative (PRN):   None at this time    Recent Visits Date Type Provider Dept  12/02/19 Procedure visit Delano Metz, MD [facility]  11/15/19 Telemedicine Delano Metz, MD [facility]  10/28/19 Procedure visit Delano Metz, MD [facility]  10/14/19 Telemedicine Delano Metz, MD [facility]  Showing recent visits within past 90 days and meeting all other requirements Today's Visits Date Type Provider Dept  12/30/19 Procedure visit Delano Metz, MD [facility]  Showing today's visits and meeting all other requirements Future Appointments Date Type Provider Dept   02/10/20 Appointment Delano Metz, MD [facility]  Showing future appointments within next 90 days and meeting all other requirements  Disposition: Discharge home  Discharge (Date  Time): 12/30/2019; 0957 hrs.   Primary Care Physician: Leda Quail, PA Location: [facility] Note by: Oswaldo Done, MD Date: 12/30/2019; Time: 12:04 PM  Disclaimer:  Medicine is not an Visual merchandiser. The only guarantee in medicine is that nothing is guaranteed. It is important to note that the decision to proceed with this intervention was based on the information collected from the patient. The Data and conclusions were drawn from the patient's questionnaire, the interview, and the physical examination. Because the information was provided in large part by the patient, it cannot be guaranteed that it has not been purposely or unconsciously manipulated. Every effort has been made to obtain as much relevant data as possible for this evaluation. It is important to note that the conclusions that lead to this procedure are derived in large part from the available data. Always take into account that the treatment will also be dependent on availability of resources and existing treatment guidelines, considered by other Pain Management Practitioners as being common knowledge and practice, at the time of the intervention. For Medico-Legal purposes, it is also important to point out that variation in procedural techniques and pharmacological choices are the acceptable norm. The indications, contraindications, technique, and results of  the above procedure should only be interpreted and judged by a Board-Certified Interventional Pain Specialist with extensive familiarity and expertise in the same exact procedure and technique.

## 2019-12-30 NOTE — Progress Notes (Signed)
Safety precautions to be maintained throughout the outpatient stay will include: orient to surroundings, keep bed in low position, maintain call bell within reach at all times, provide assistance with transfer out of bed and ambulation.  

## 2019-12-31 ENCOUNTER — Telehealth: Payer: Self-pay

## 2019-12-31 NOTE — Telephone Encounter (Signed)
Post procedure phone call. Patient states she is doing good.  

## 2020-01-06 ENCOUNTER — Ambulatory Visit: Payer: Managed Care, Other (non HMO) | Admitting: Pain Medicine

## 2020-01-07 NOTE — Progress Notes (Signed)
This patient's chart is under "My Open Charts". These are cancelled appointments that keep popping into my "In Basket" as a deficiency. See what you can do to remove them.   Thank you. 

## 2020-02-10 ENCOUNTER — Telehealth: Payer: Self-pay

## 2020-02-10 ENCOUNTER — Other Ambulatory Visit: Payer: Self-pay

## 2020-02-10 ENCOUNTER — Ambulatory Visit: Payer: Managed Care, Other (non HMO) | Attending: Pain Medicine | Admitting: Pain Medicine

## 2020-02-10 ENCOUNTER — Encounter: Payer: Self-pay | Admitting: Pain Medicine

## 2020-02-10 ENCOUNTER — Ambulatory Visit: Payer: Managed Care, Other (non HMO) | Admitting: Pain Medicine

## 2020-02-10 DIAGNOSIS — M47816 Spondylosis without myelopathy or radiculopathy, lumbar region: Secondary | ICD-10-CM

## 2020-02-10 DIAGNOSIS — M5137 Other intervertebral disc degeneration, lumbosacral region: Secondary | ICD-10-CM

## 2020-02-10 DIAGNOSIS — M4310 Spondylolisthesis, site unspecified: Secondary | ICD-10-CM

## 2020-02-10 DIAGNOSIS — M51379 Other intervertebral disc degeneration, lumbosacral region without mention of lumbar back pain or lower extremity pain: Secondary | ICD-10-CM

## 2020-02-10 DIAGNOSIS — G8929 Other chronic pain: Secondary | ICD-10-CM

## 2020-02-10 DIAGNOSIS — M545 Low back pain, unspecified: Secondary | ICD-10-CM

## 2020-02-10 NOTE — Progress Notes (Signed)
Unsuccessful attempt to contact patient for Virtual Visit (Pain Management Telehealth)   Patient provided contact information:  [phone] (home); [phone] (mobile); (Preferred) [phone] [email or web address]   Pre-screening:  Our staff was successful in contacting Tonya Robertson using the above provided information.   I unsuccessfully attempted to make contact with Tonya Robertson on 3 different locations on 02/10/2020 via telephone. I was unable to complete the virtual encounter due to call going directly to voicemail. I was able to leave a message.   Transfered: Gabapentin (Neurontin) 300 mg capsule, 3 capsules p.o. 3 times daily (270/month) (04/11/2020); cyanocobalamin (vitamin B12) 5000 mcg sublingual, 1 p.o. daily (30/month) (04/11/2020)  Post-Procedure Evaluation  Procedure (12/30/2019): Therapeutic right sided lumbar facet RFA #1 under fluoroscopic guidance and IV sedation Pre-procedure pain level: 0/10 Post-procedure: 0/10          Sedation: Sedation provided.  Effectiveness during initial hour after procedure(Ultra-Short Term Relief): 100 %.  Local anesthetic used: Long-acting (4-6 hours) Effectiveness: Defined as any analgesic benefit obtained secondary to the administration of local anesthetics. This carries significant diagnostic value as to the etiological location, or anatomical origin, of the pain. Duration of benefit is expected to coincide with the duration of the local anesthetic used.  Effectiveness during initial 4-6 hours after procedure(Short-Term Relief): 100 %.  Long-term benefit: Defined as any relief past the pharmacologic duration of the local anesthetics.  Effectiveness past the initial 6 hours after procedure(Long-Term Relief): 90 %.  Pharmacotherapy Assessment  Analgesic: None Highest recorded MME/day: 25 mg/day MME/day: 0 mg/day   Follow-up plan:   Reschedule Visit.     Interventional treatment options: Planned, scheduled, and/or pending:   Therapeutic  left-sided lumbar facet RFA #1    Under consideration:   Diagnostic bilateral lumbar facet block #2  Therapeutic bilateral intra-articular L4-5 facet injection  Possible bilateral lumbar facet RFA  Diagnostic left SI joint block  Possible left SI joint RFA  Diagnostic right-sided L2-3 LESI  Diagnostic right-sided L3-4 LESI  Diagnostic right-sided L4-5 LESI  Diagnostic bilateral L1 TFESI  Diagnostic bilateral L2 TFESI  Diagnostic bilateral L3 TFESI  Diagnostic bilateral L4 TFESI  Diagnostic bilateral L5 TFESI    Therapeutic/palliative (PRN):   None at this time     Recent Visits Date Type Provider Dept  12/30/19 Procedure visit Delano Metz, MD [facility]  12/02/19 Procedure visit Delano Metz, MD [facility]  11/15/19 Telemedicine Delano Metz, MD [facility]  Showing recent visits within past 90 days and meeting all other requirements Today's Visits Date Type Provider Dept  02/10/20 Telemedicine Delano Metz, MD [facility]  Showing today's visits and meeting all other requirements Future Appointments No visits were found meeting these conditions. Showing future appointments within next 90 days and meeting all other requirements   Note by: Oswaldo Done, MD Date: 02/10/2020; Time: 4:38 PM

## 2020-02-10 NOTE — Telephone Encounter (Signed)
LM  to call office for pre virtual appointment questions 

## 2020-02-15 ENCOUNTER — Ambulatory Visit: Payer: Managed Care, Other (non HMO) | Admitting: Pain Medicine

## 2020-07-13 IMAGING — CR DG SI JOINTS 3+V
3 series · 3 of 3 positions shown · non-contrast
Comparison: None.

CLINICAL DATA: Pain

EXAM:
BILATERAL SACROILIAC JOINTS - 3+ VIEW

[si joints ap]
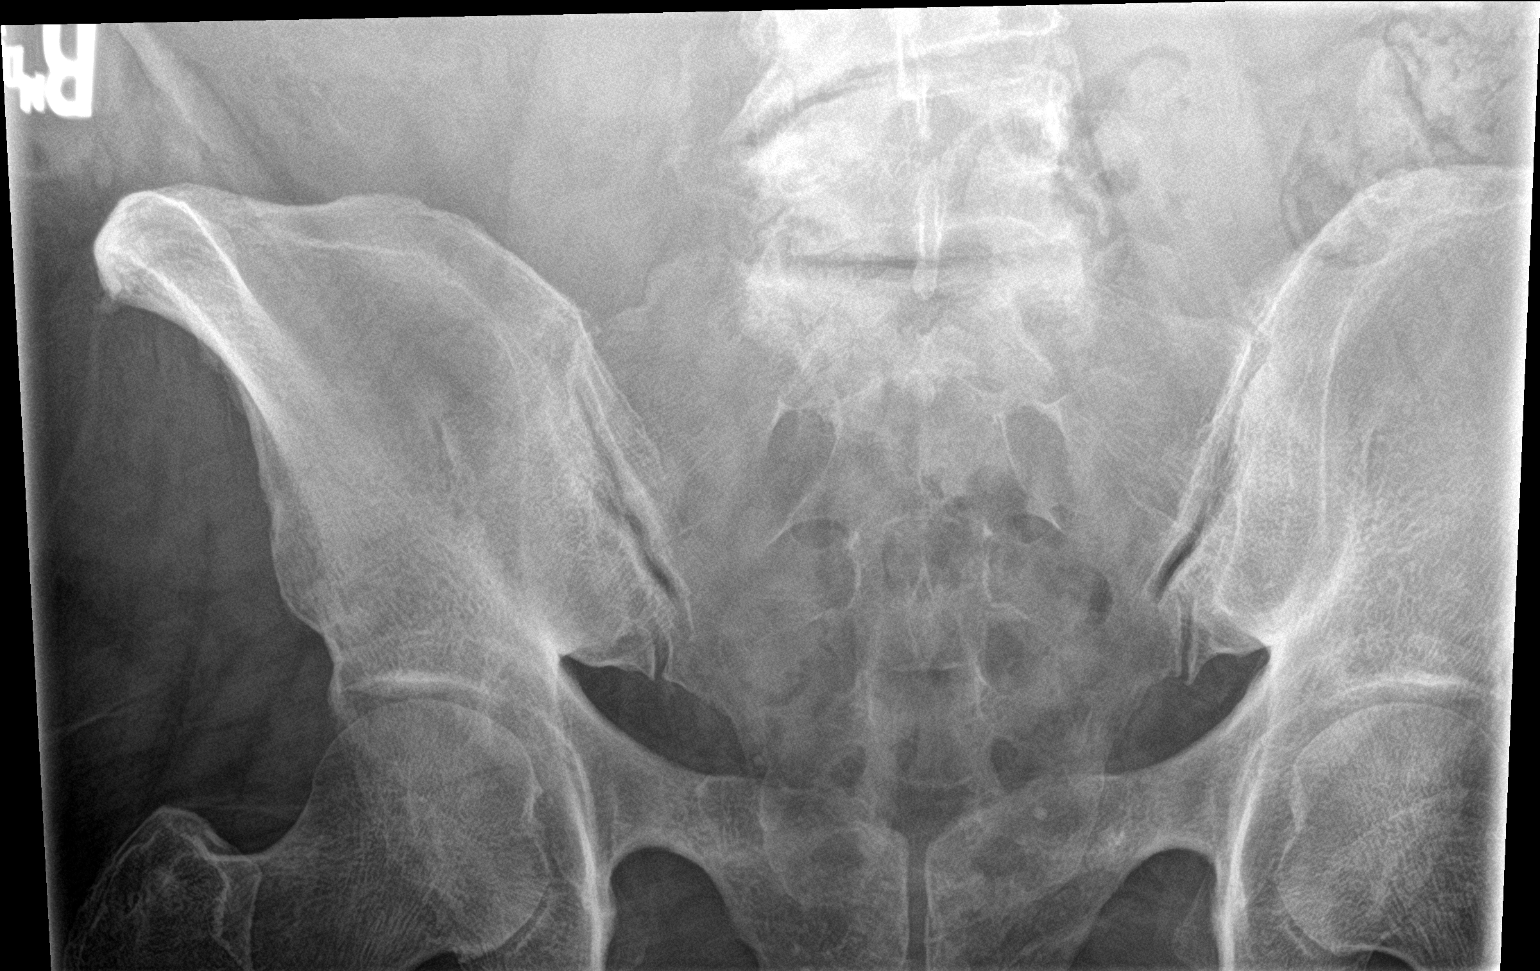

[si joints obl (1 of 2)]
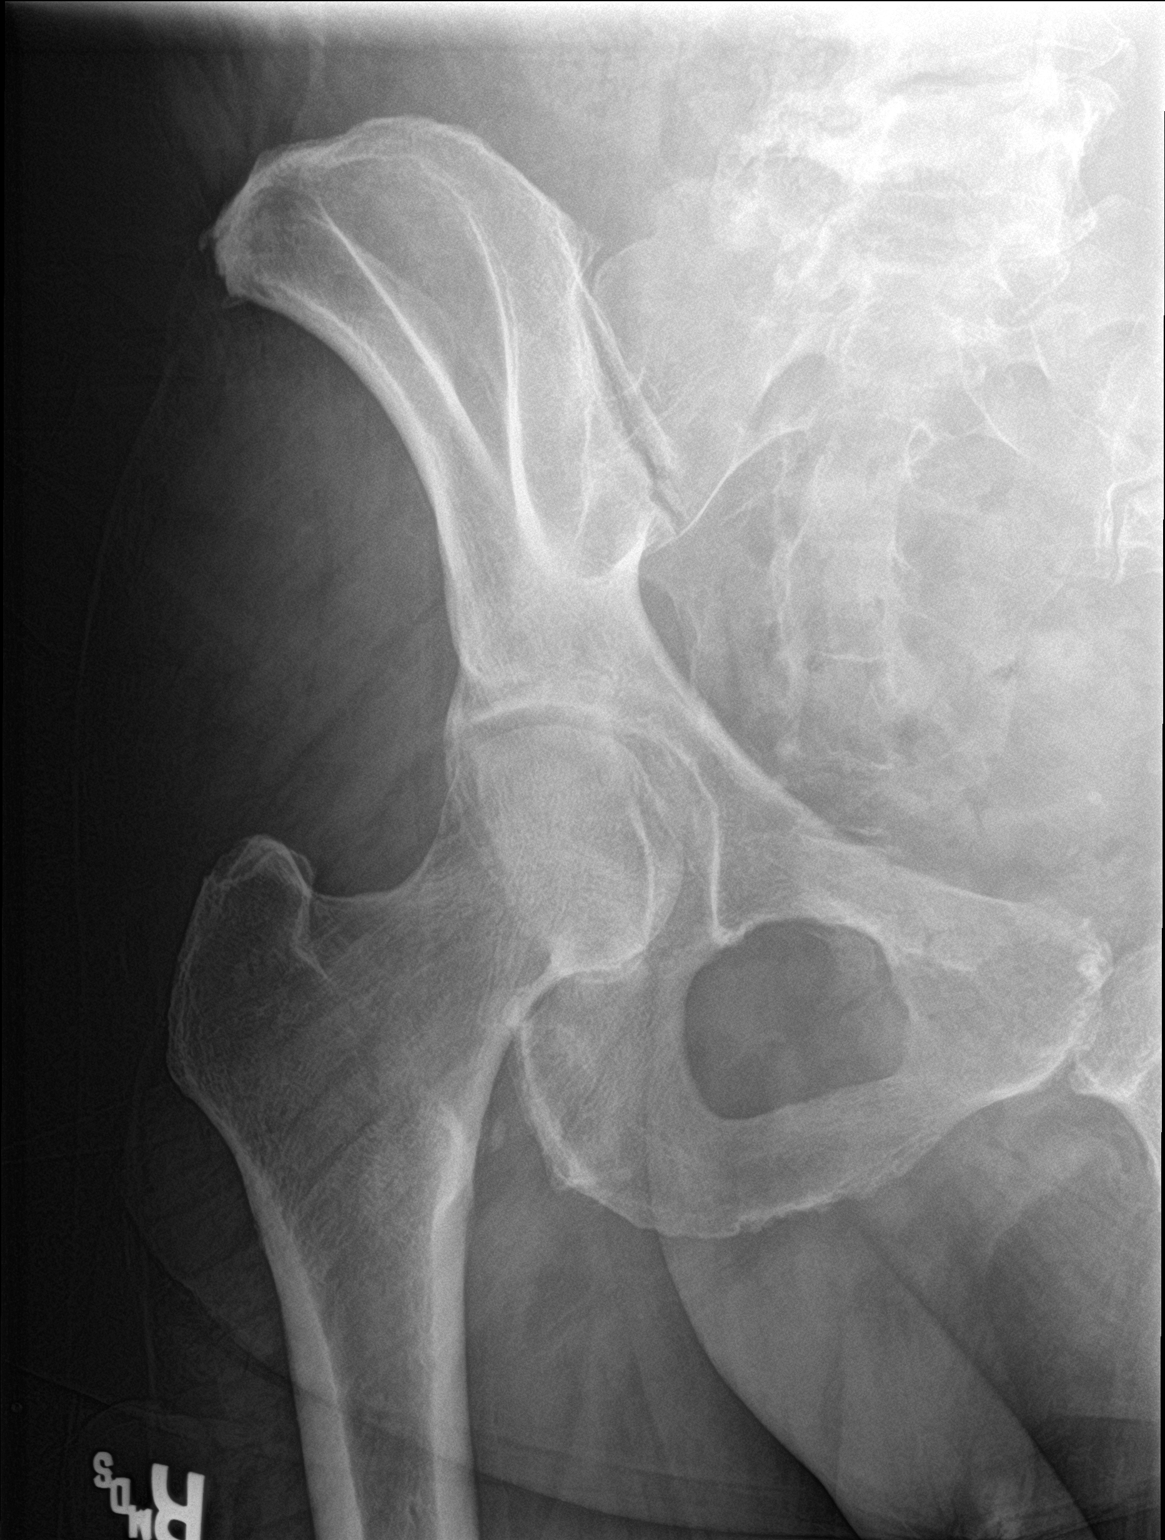

[si joints obl (2 of 2)]
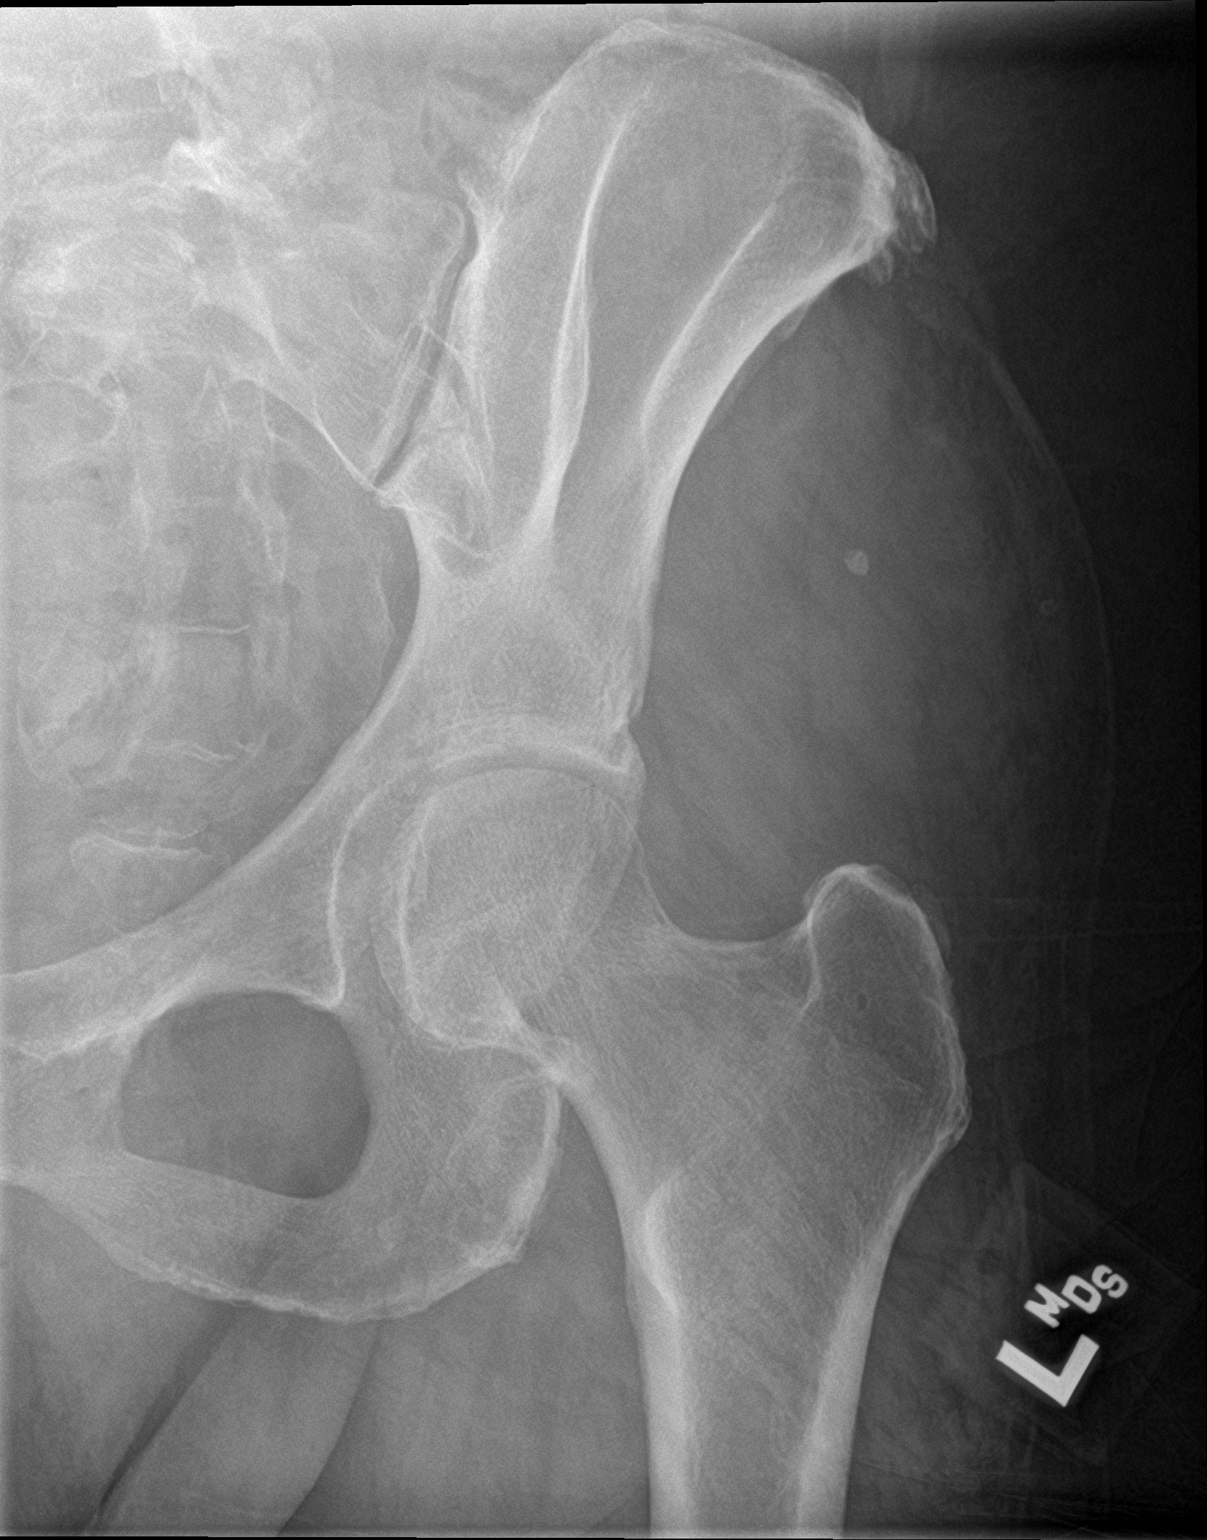

[3 of 3 positions shown; findings below may reference images not displayed]

FINDINGS: Oblique frontal as well as bilateral oblique views obtained. The
sacroiliac joints appear symmetric with normal joint spaces in the
sacroiliac joint regions. No fracture or diastasis. No erosive
change.

There is degenerative change in the lower lumbar spine. There is
mild symmetric narrowing of each hip joint.
IMPRESSION: Normal appearing sacroiliac joints. Lower lumbar arthropathy with
spurs noted on the right and disc space narrowing in the lower
lumbar region. Mild symmetric narrowing of each hip joint.

## 2020-07-13 IMAGING — CR DG LUMBAR SPINE COMPLETE W/ BEND
7 series · 7 of 7 positions shown · non-contrast
Comparison: Lumbar MRI 07/21/2019

CLINICAL DATA: Axial back pain with possible radiculopathy.

EXAM:
LUMBAR SPINE - COMPLETE WITH BENDING VIEWS

[l-spine ap]
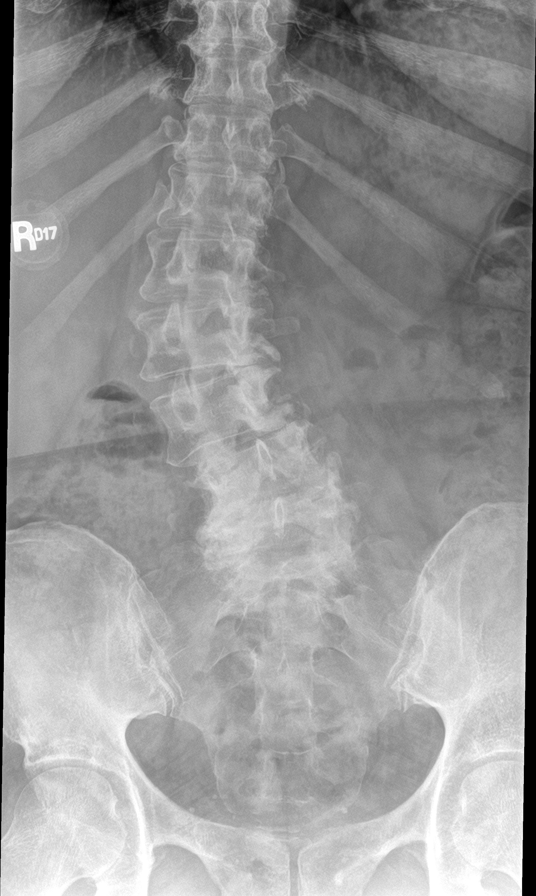

[l-spine obl (1 of 2)]
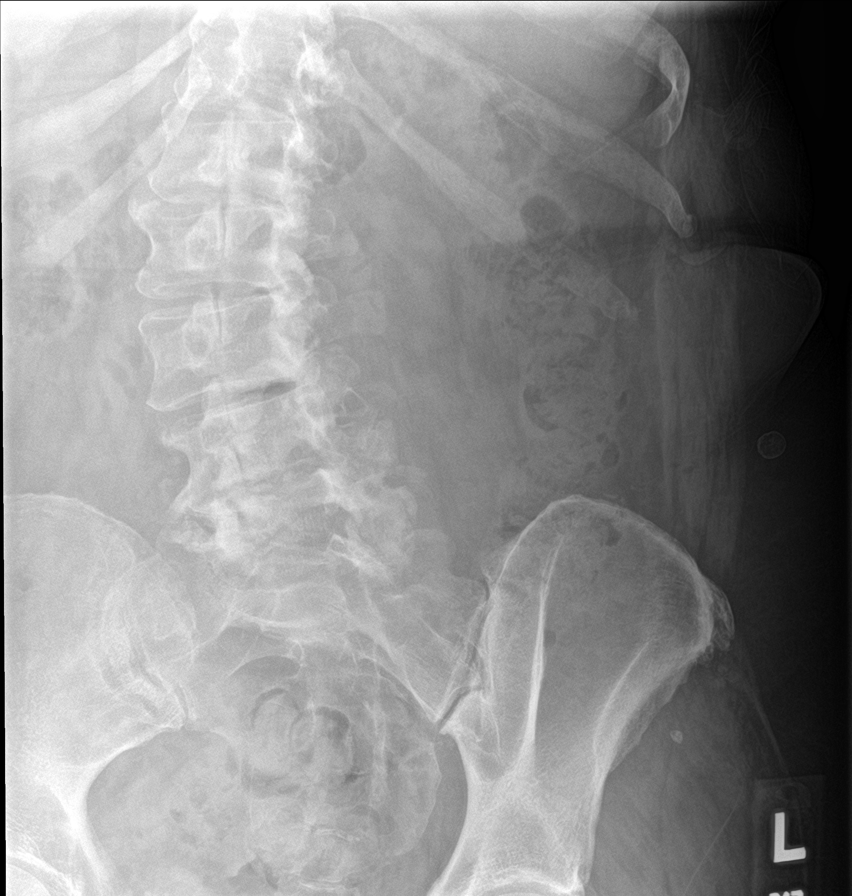

[l-spine obl (2 of 2)]
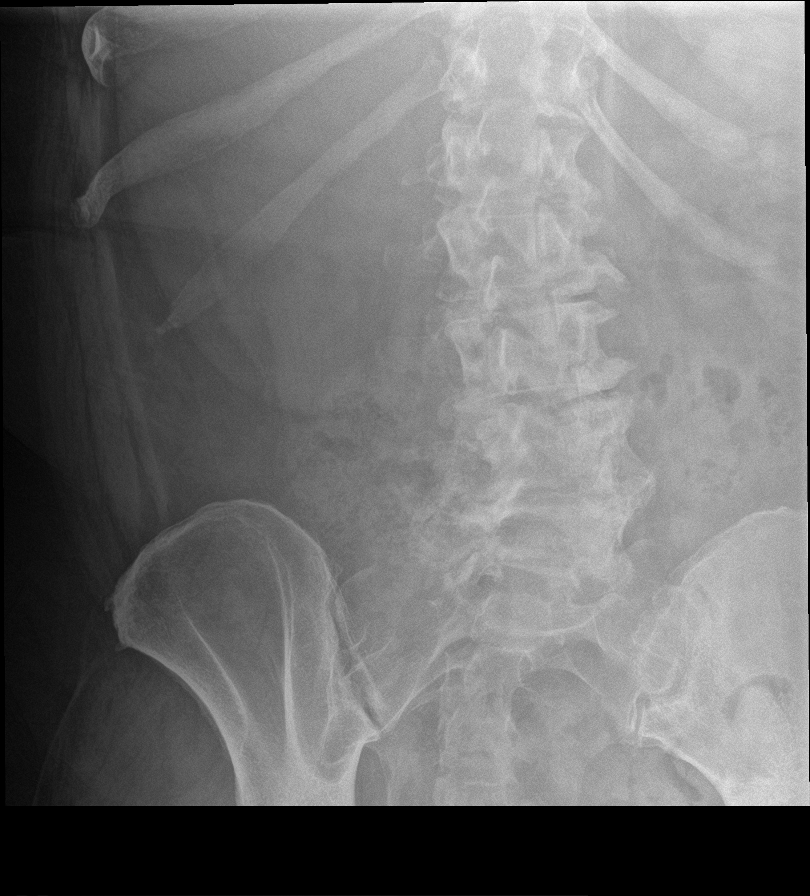

[l-spine lat]
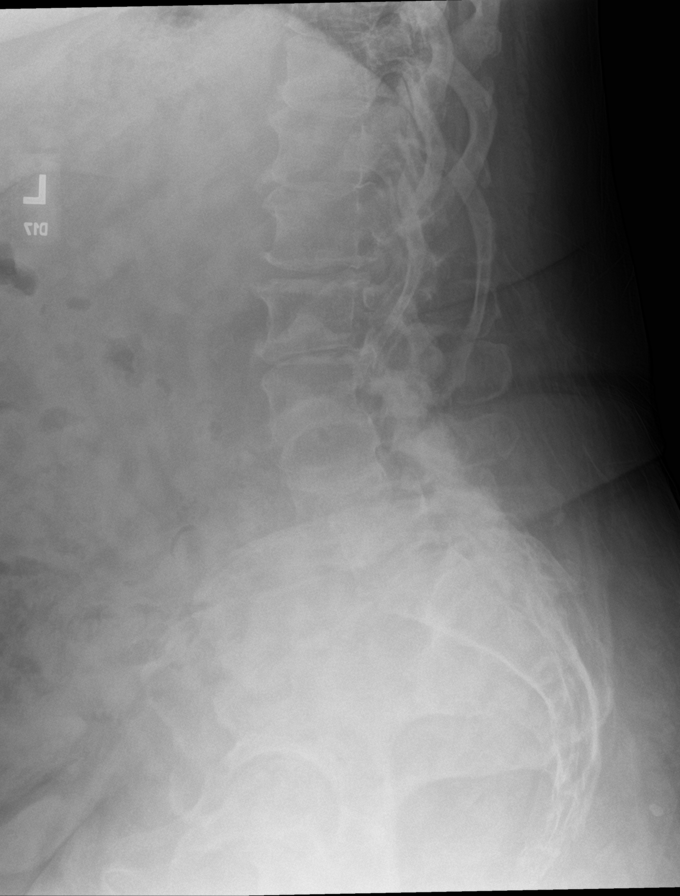

[l-spine flex]
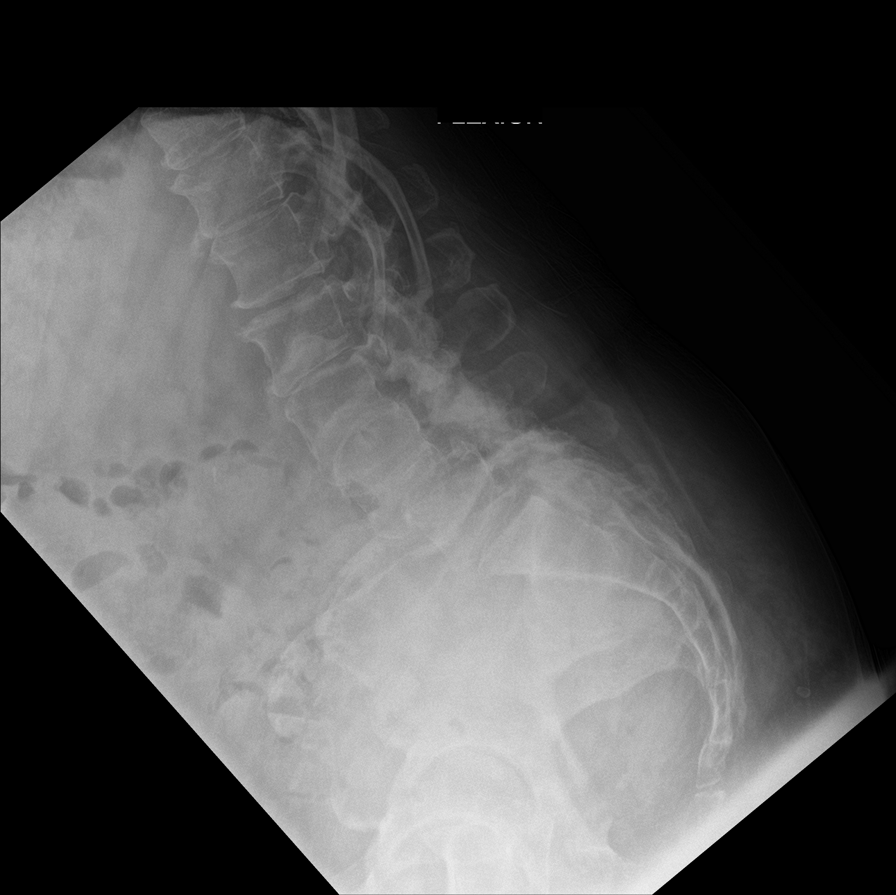

[l-spine ext]
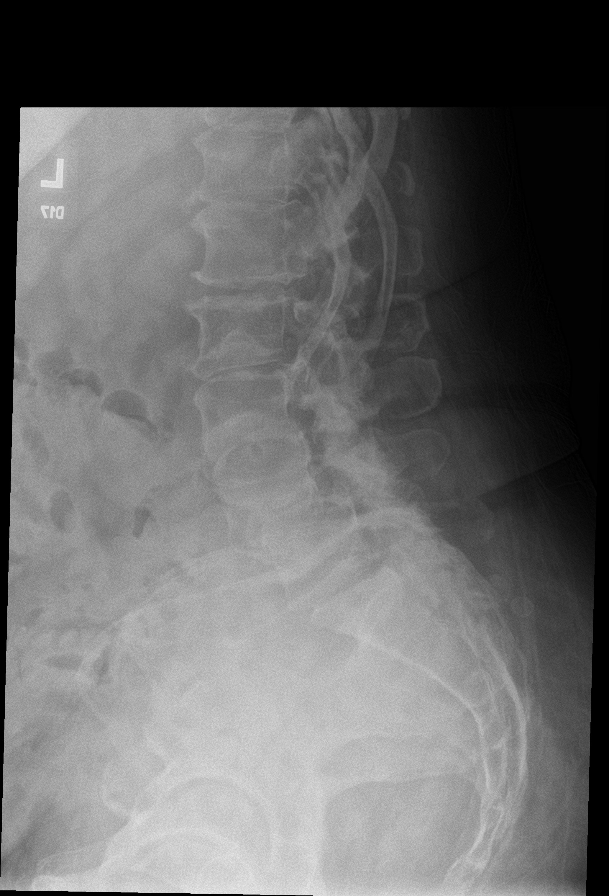

[l-spine spot]
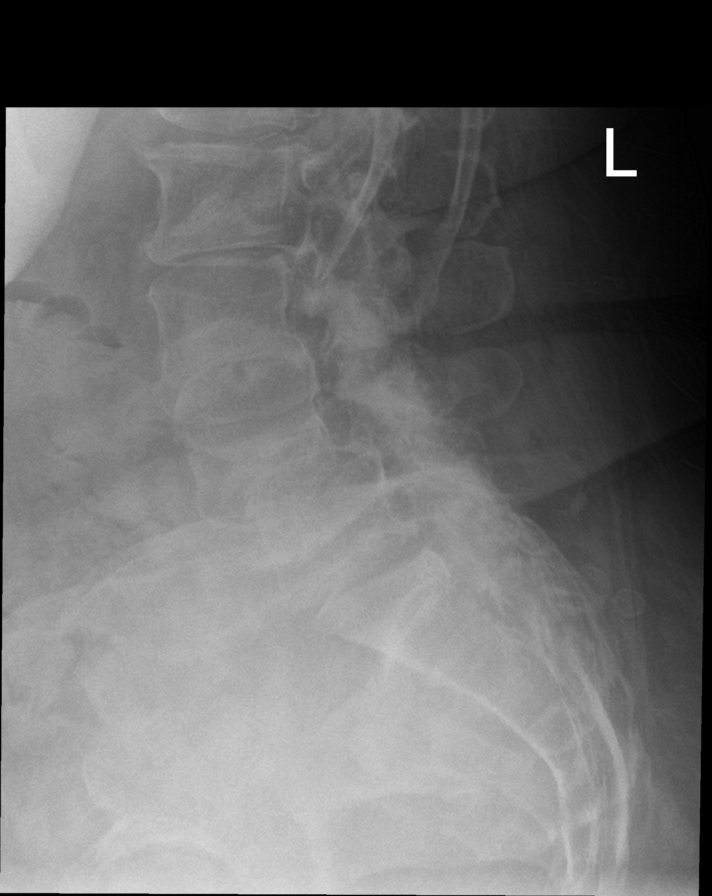

[7 of 7 positions shown; findings below may reference images not displayed]

FINDINGS: Moderate dextroscoliosis at L2. Mild lateral subluxation L3 on L4.
Grade 1 anterolisthesis L4-5.

Negative for fracture or pars defect.

Disc degeneration and spurring left-sided L2-3 and L3-4. Disc
degeneration and spurring right greater than left at L4-5. Diffuse
facet degeneration in the lumbar spine most notably L3-4, L4-5,
L5-S1 as noted on prior MRI.
IMPRESSION: Scoliosis and advanced degenerative change. No acute abnormality.
See prior MRI report.

## 2021-05-22 ENCOUNTER — Ambulatory Visit: Payer: Self-pay

## 2021-05-22 ENCOUNTER — Other Ambulatory Visit: Payer: Self-pay

## 2021-05-22 ENCOUNTER — Encounter: Payer: Self-pay | Admitting: Internal Medicine

## 2021-05-22 ENCOUNTER — Ambulatory Visit: Payer: Managed Care, Other (non HMO) | Admitting: Internal Medicine

## 2021-05-22 VITALS — BP 130/72 | HR 97 | Temp 97.4°F | Resp 16 | Ht 64.0 in | Wt 195.5 lb

## 2021-05-22 DIAGNOSIS — I10 Essential (primary) hypertension: Secondary | ICD-10-CM

## 2021-05-22 DIAGNOSIS — M1A9XX0 Chronic gout, unspecified, without tophus (tophi): Secondary | ICD-10-CM | POA: Diagnosis not present

## 2021-05-22 DIAGNOSIS — R634 Abnormal weight loss: Secondary | ICD-10-CM

## 2021-05-22 DIAGNOSIS — F3341 Major depressive disorder, recurrent, in partial remission: Secondary | ICD-10-CM | POA: Diagnosis not present

## 2021-05-22 DIAGNOSIS — Z1231 Encounter for screening mammogram for malignant neoplasm of breast: Secondary | ICD-10-CM

## 2021-05-22 MED ORDER — OLMESARTAN-AMLODIPINE-HCTZ 40-10-25 MG PO TABS: 1.0000 | ORAL_TABLET | Freq: Every day | ORAL | 3 refills | Status: DC

## 2021-05-22 MED ORDER — BUPROPION HCL ER (XL) 300 MG PO TB24: 300.0000 mg | ORAL_TABLET | Freq: Every day | ORAL | 3 refills | Status: DC

## 2021-05-22 MED ORDER — DULOXETINE HCL 20 MG PO CPEP: 20.0000 mg | ORAL_CAPSULE | Freq: Every day | ORAL | 1 refills | Status: DC

## 2021-05-22 MED ORDER — WEGOVY 1 MG/0.5ML ~~LOC~~ SOAJ: 1.0000 mg | SUBCUTANEOUS | 3 refills | Status: DC

## 2021-05-22 MED ORDER — ALLOPURINOL 300 MG PO TABS: ORAL_TABLET | ORAL | 3 refills | Status: DC

## 2021-05-22 MED ORDER — HYDROXYZINE HCL 50 MG PO TABS: ORAL_TABLET | ORAL | 0 refills | Status: DC

## 2021-05-22 NOTE — Patient Instructions (Addendum)
It was great seeing you today!  Plan discussed at today's visit: -Blood work ordered today, results will be uploaded to MyChart.  -Start taking Pristqu at 50 mg daily for 1 week, then take Cymbalta 20 mg once a day for 2 weeks and then go up to 40 mg daily -Refills sent to pharmacy -Please fax blood work results -Mammogram ordered -Call insurance to figure out coverage on diagnostic colonoscopy  Follow up in: 1 month   Take care and let us know if you have any questions or concerns prior to your next visit.  Dr. Caralee Ates

## 2021-05-22 NOTE — Progress Notes (Signed)
New Patient Office Visit  Subjective:  Patient ID: Tonya Robertson, female    DOB: 10/14/1965  Age: 55 y.o. MRN: [id number]  CC:  Chief Complaint  Patient presents with   Establish Care    HPI Tonya Robertson presents as a new patient.   Hypertension: -Medications: Olmesartan-Amlodipine-HCTZ 40-10-25 -Patient is compliant with above medications and reports no side effects. -Checking BP at home (average): doesn't check at home, does sometimes at work and it averages around 130/80 -Denies any SOB, CP, vision changes, LE edema or symptoms of hypotension   Gout: -Currently on Allopurniol 300 mg daily, doesn't take everyday  -Takes Colchicine as needed -Flares now every 3 months, had been a year, doesn't think diet is causing flares  MDD: -Mood status: stable -Current treatment: Pristiq 100, Wellbutrin 300 mg   -Satisfied with current treatment?: no -Symptom severity: mild  -Duration of current treatment : months -Side effects: no Medication compliance: good compliance Psychotherapy/counseling: no    Previous psychiatric medications: Had been on Cymbalta in the past, helped with back pain as well Depressed mood: no Anxious mood: no Anhedonia: no Significant weight loss or gain: no Insomnia: no  Fatigue: yes  Does take Wegovy for weight loss, lost about 20 pounds in 1 year on it.   Health Maintenance: -Blood work - just had in August, will fax results -Mammogram due -Discussed colon cancer screening, will call insurance about specific questions and we can order over the phone   Depression screen [facility] 2/9 05/22/2021 12/30/2019 12/02/2019  Decreased Interest 0 0 0  Down, Depressed, Hopeless 0 0 0  PHQ - 2 Score 0 0 0  Altered sleeping 0 - -  Tired, decreased energy 0 - -  Change in appetite 0 - -  Feeling bad or failure about yourself  0 - -  Trouble concentrating 0 - -  Moving slowly or fidgety/restless 0 - -  Suicidal thoughts 0 - -  PHQ-9 Score 0 - -  Difficult doing  work/chores Not difficult at all - -     Past Medical History:  Diagnosis Date   Hypertension     Past Surgical History:  Procedure Laterality Date   ABDOMINAL HYSTERECTOMY  2008   partial    Family History  Adopted: Yes    Social History   Socioeconomic History   Marital status: Unknown    Spouse name: Not on file   Number of children: Not on file   Years of education: Not on file   Highest education level: Not on file  Occupational History   Not on file  Tobacco Use   Smoking status: Every Day   Smokeless tobacco: Never  Vaping Use   Vaping Use: Some days  Substance and Sexual Activity   Alcohol use: Yes    Comment: Occasionally   Drug use: Never   Sexual activity: Not Currently  Other Topics Concern   Not on file  Social History Narrative   Not on file   Social Determinants of Health   Financial Resource Strain: Not on file  Food Insecurity: Not on file  Transportation Needs: Not on file  Physical Activity: Not on file  Stress: Not on file  Social Connections: Not on file  Intimate Partner Violence: Not on file    ROS Review of Systems  Constitutional:  Positive for fatigue. Negative for chills and fever.  Respiratory:  Negative for cough and shortness of breath.   Cardiovascular:  Negative for chest pain and palpitations.  Gastrointestinal:  Negative for abdominal pain, nausea and vomiting.  Musculoskeletal:  Positive for back pain.  Neurological:  Negative for dizziness and headaches.   Objective:   Today's Vitals: BP 130/72    Pulse 97    Temp (!) 97.4 F (36.3 C) (Oral)    Resp 16    Ht 5\' 4"  (1.626 m)    Wt 195 lb 8 oz (88.7 kg)    SpO2 99%    BMI 33.56 kg/m   Physical Exam Constitutional:      Appearance: Normal appearance.  HENT:     Head: Normocephalic and atraumatic.     Mouth/Throat:     Mouth: Mucous membranes are moist.     Pharynx: Oropharynx is clear.  Eyes:     Conjunctiva/sclera: Conjunctivae normal.  Cardiovascular:      Rate and Rhythm: Normal rate and regular rhythm.  Pulmonary:     Effort: Pulmonary effort is normal.     Breath sounds: Normal breath sounds.  Musculoskeletal:     Right lower leg: No edema.     Left lower leg: No edema.  Skin:    General: Skin is warm and dry.  Neurological:     General: No focal deficit present.     Mental Status: She is alert. Mental status is at baseline.  Psychiatric:        Mood and Affect: Mood normal.        Behavior: Behavior normal.    Assessment & Plan:   1. Hypertension, unspecified type: Stable, blood pressure medication refilled.   - Olmesartan-amLODIPine-HCTZ 40-10-25 MG TABS; Take 1 tablet by mouth daily.  Dispense: 90 tablet; Refill: 3  2. Chronic gout involving toe without tophus, unspecified cause, unspecified laterality: Will start taking Allopurinol everyday to help prevent flares.  - allopurinol (ZYLOPRIM) 300 MG tablet; allopurinol 300 mg tablet  TAKE 1 TABLET BY MOUTH EVERY DAY  Dispense: 90 tablet; Refill: 3  3. Recurrent major depressive disorder, in partial remission (HCC): Will taper down on Pristiq and restart Cymbalta 20 mg for 2 weeks then 40 mg until follow up in 1 month with goal to be back on 60 mg daily. Wellbutrin refilled as well. Hydroxyzine as needed.  - DULoxetine (CYMBALTA) 20 MG capsule; Take 1 capsule (20 mg total) by mouth daily.  Dispense: 30 capsule; Refill: 1 - hydrOXYzine (ATARAX) 50 MG tablet; hydroxyzine HCl 50 mg tablet  Dispense: 30 tablet; Refill: 0 - buPROPion (WELLBUTRIN XL) 300 MG 24 hr tablet; Take 1 tablet (300 mg total) by mouth daily.  Dispense: 90 tablet; Refill: 3  4. Weight loss: Stable, Wegovy refilled.   - Semaglutide-Weight Management (WEGOVY) 1 MG/0.5ML SOAJ; Inject 1 mg into the skin once a week.  Dispense: 2 mL; Refill: 3  5. Encounter for screening mammogram for malignant neoplasm of breast: Mammogram ordered today.  - MM 3D SCREEN BREAST BILATERAL; Future   Follow-up: Return in about  4 weeks (around 06/19/2021).   06/21/2021, DO

## 2021-05-22 NOTE — Telephone Encounter (Signed)
Atarax needs clafication on how to take and in AVS not clear either to give pharmacy directions. Will send to provider to reach out.   hydrOXYzine (ATARAX) 50 MG tablet; hydroxyzine HCl 50 mg tablet  Dispense: 30 tablet; Refill: 0  Summary: Rx need directions   Pharmacy called in to inform Dr that Rx for hydrOXYzine (ATARAX) 50 MG tablet need some directions please send new Rx or call [facility].

## 2021-05-23 ENCOUNTER — Telehealth: Payer: Self-pay

## 2021-05-23 MED ORDER — HYDROXYZINE HCL 50 MG PO TABS: ORAL_TABLET | ORAL | 0 refills | Status: DC

## 2021-05-23 NOTE — Telephone Encounter (Signed)
They need to know a quanity per day 1 max a day or 1-2 tablets a day prn?

## 2021-05-23 NOTE — Addendum Note (Signed)
Addended by: Margarita Mail on: 05/23/2021 09:23 AM   Modules accepted: Orders

## 2021-05-23 NOTE — Telephone Encounter (Signed)
Copied from CRM [id number]. Topic: Quick Communication - Rx Refill/Question >> May 23, 2021  3:23 PM Pawlus, Maxine Glenn A wrote: Pharmacist calling in needing more clarification on the dosing instructions for hydrOXYzine (ATARAX) 50 MG tablet

## 2021-05-24 MED ORDER — HYDROXYZINE HCL 50 MG PO TABS: 50.0000 mg | ORAL_TABLET | Freq: Three times a day (TID) | ORAL | 0 refills | Status: DC | PRN

## 2021-05-24 NOTE — Addendum Note (Signed)
Addended by: Margarita Mail on: 05/24/2021 09:06 AM   Modules accepted: Orders

## 2021-05-30 ENCOUNTER — Telehealth: Payer: Self-pay

## 2021-05-30 NOTE — Telephone Encounter (Signed)
She should still be able to cut right?

## 2021-05-30 NOTE — Telephone Encounter (Signed)
Copied from CRM [id number]. Topic: General - Other >> May 30, 2021  1:58 PM Jaquita Rector A wrote: Reason for CRM: Patient called in to inform Dr Caralee Ates that she can not cut the desvenlafaxine (PRISTIQ) 100 MG 24 hr tablet because they are not scored so patient is not sure what to do. Please call patient Ph# [phone]

## 2021-05-31 NOTE — Telephone Encounter (Signed)
Pt.notified

## 2021-06-19 ENCOUNTER — Ambulatory Visit: Payer: Managed Care, Other (non HMO) | Admitting: Internal Medicine

## 2021-07-04 ENCOUNTER — Telehealth: Payer: Self-pay | Admitting: Internal Medicine

## 2021-07-04 DIAGNOSIS — Z1211 Encounter for screening for malignant neoplasm of colon: Secondary | ICD-10-CM

## 2021-07-04 NOTE — Telephone Encounter (Signed)
Patient called in asking , if she needs to  pick up cloguard or will it be sent to her.

## 2021-08-21 ENCOUNTER — Other Ambulatory Visit: Payer: Self-pay | Admitting: Internal Medicine

## 2021-08-23 NOTE — Telephone Encounter (Signed)
Requested medications are due for refill today.  unsure ? ?Requested medications are on the active medications list.  yes ? ?Last refill. 10/09/2019 ? ?Future visit scheduled.   no ? ?Notes to clinic.  Medication listed as historical from historical provider. ? ? ? ?Requested Prescriptions  ?Pending Prescriptions Disp Refills  ? albuterol (VENTOLIN HFA) 108 (90 Base) MCG/ACT inhaler [Pharmacy Med Name: ALBUTEROL HFA INH (200 PUFFS) 6.7GM] 6.7 g   ?  Sig: INHALE 2 PUFFS BY MOUTH EVERY 6 HOURS AS NEEDED FOR WHEEZING  ?  ? Pulmonology:  Beta Agonists 2 Passed - 08/21/2021  3:59 PM  ?  ?  Passed - Last BP in normal range  ?  BP Readings from Last 1 Encounters:  ?05/22/21 130/72  ?  ?  ?  ?  Passed - Last Heart Rate in normal range  ?  Pulse Readings from Last 1 Encounters:  ?05/22/21 97  ?  ?  ?  ?  Passed - Valid encounter within last 12 months  ?  Recent Outpatient Visits   ? ?      ? 3 months ago Hypertension, unspecified type  ? [facility] Margarita Mail, DO  ? ?  ?  ? ?  ?  ?  ?  ?

## 2021-10-03 ENCOUNTER — Other Ambulatory Visit: Payer: Self-pay | Admitting: Internal Medicine

## 2021-10-03 DIAGNOSIS — F3341 Major depressive disorder, recurrent, in partial remission: Secondary | ICD-10-CM

## 2021-10-03 NOTE — Telephone Encounter (Signed)
Requested medication (s) are due for refill today: yes ? ?Requested medication (s) are on the active medication list: yes   ? ?Last refill: 05/22/21  #30   1 refill ? ?Future visit scheduled no ? ?Notes to clinic:Failed due to labs, please review. ? ?Requested Prescriptions  ?Pending Prescriptions Disp Refills  ? DULoxetine (CYMBALTA) 20 MG capsule [Pharmacy Med Name: DULOXETINE DR 20MG CAPSULES] 30 capsule 1  ?  Sig: TAKE 1 CAPSULE(20 MG) BY MOUTH DAILY  ?  ? Psychiatry: Antidepressants - SNRI - duloxetine Failed - 10/03/2021  9:33 AM  ?  ?  Failed - Cr in normal range and within 360 days  ?  Creatinine, Ser  ?Date Value Ref Range Status  ?08/17/2019 0.87 0.57 - 1.00 mg/dL Final  ?  ?  ?  ?  Failed - eGFR is 30 or above and within 360 days  ?  GFR calc Af Amer  ?Date Value Ref Range Status  ?08/17/2019 89 >59 mL/min/1.73 Final  ? ?GFR calc non Af Amer  ?Date Value Ref Range Status  ?08/17/2019 77 >59 mL/min/1.73 Final  ?  ?  ?  ?  Passed - Completed PHQ-2 or PHQ-9 in the last 360 days  ?  ?  Passed - Last BP in normal range  ?  BP Readings from Last 1 Encounters:  ?05/22/21 130/72  ?  ?  ?  ?  Passed - Valid encounter within last 6 months  ?  Recent Outpatient Visits   ? ?      ? 4 months ago Hypertension, unspecified type  ? [facility] Teodora Medici, DO  ? ?  ?  ? ? ?  ?  ?  ? ? ? ? ?

## 2021-10-22 NOTE — Progress Notes (Unsigned)
PROVIDER NOTE: Information contained herein reflects review and annotations entered in association with encounter. Interpretation of such information and data should be left to medically-trained personnel. Information provided to patient can be located elsewhere in the medical record under "Patient Instructions". Document created using STT-dictation technology, any transcriptional errors that may result from process are unintentional.    Patient: Tonya Robertson  Service Category: E/M  Provider: Gaspar Cola, MD  DOB: 1966/02/08  DOS: 10/24/2021  Specialty: Interventional Pain Management  MRN: [id number]  Setting: Ambulatory outpatient  PCP: Tonya Medici, DO  Type: Established Patient    Referring Provider: Teodora Medici, DO  Location: Office  Delivery: Face-to-face     HPI  [name], a 56 y.o. year old female, is here today because of her No primary diagnosis found.. [name] primary complain today is No chief complaint on file. Last encounter: My last encounter with her was on Visit date not found. Pertinent problems: [name] has Chronic pain syndrome; Chronic musculoskeletal pain; Spasm of muscle of lower back; Chronic low back pain (1ry area of Pain) (Bilateral) (R>L) w/o sciatica; Inflammatory spondylopathy of lumbosacral region 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 Pain Assessment: Severity of   is reported as a  /10. Location:    / . Onset:  . Quality:  . Timing:  . Modifying factor(s):  Marland Kitchen Vitals:  vitals were not taken for this visit.   Reason for encounter:  *** . ***  Pharmacotherapy Assessment  Analgesic: None Highest recorded MME/day: 25 mg/day MME/day: 0 mg/day   Monitoring: [name] PMP: PDMP reviewed during this encounter.       Pharmacotherapy: No side-effects or adverse reactions reported. Compliance: No problems identified. Effectiveness: Clinically acceptable.  No notes on file  UDS:  No results found for: SUMMARY   ROS  Constitutional: Denies any fever or chills Gastrointestinal: No reported hemesis, hematochezia, vomiting, or acute GI distress Musculoskeletal: Denies any acute onset joint swelling, redness, loss of ROM, or weakness Neurological: No reported episodes of acute onset apraxia, aphasia, dysarthria, agnosia, amnesia, paralysis, loss of coordination, or loss of consciousness  Medication Review  DULoxetine, Olmesartan-amLODIPine-HCTZ, Semaglutide-Weight Management, albuterol, allopurinol, buPROPion, colchicine, and hydrOXYzine  History Review  Allergy: [name] has No Known Allergies. Drug: [name]  reports no history of drug use. Alcohol:  reports current alcohol use. Tobacco:  reports that she has been smoking. She has never used smokeless tobacco. Social: [name]  reports that she has been smoking. She has never used smokeless tobacco. She reports current alcohol use. She reports that she does not use drugs. Medical:  has a past medical history of Hypertension. Surgical: [name]  has a past surgical history that includes Abdominal hysterectomy (2008). Family: family history is not on file. She was adopted.  Laboratory Chemistry Profile   Renal Lab Results  Component Value Date   BUN 17 08/17/2019   CREATININE 0.87 08/17/2019   BCR 20 08/17/2019    GFRAA 89 08/17/2019  GFRNONAA 77 08/17/2019    Hepatic Lab Results  Component Value Date   AST 15 08/17/2019   ALBUMIN 4.2 08/17/2019   ALKPHOS 110 08/17/2019    Electrolytes Lab Results  Component Value Date   NA 139 08/17/2019   K 4.0 08/17/2019   CL 100 08/17/2019   CALCIUM 9.5 08/17/2019   MG 1.9 08/17/2019    Bone Lab Results  Component Value Date   25OHVITD1 16 (L) 08/17/2019   25OHVITD2 <1.0 08/17/2019   25OHVITD3 15 08/17/2019    Inflammation (CRP: Acute Phase) (ESR: Chronic Phase) Lab Results  Component Value Date   CRP 21 (H) 08/17/2019   ESRSEDRATE 43 (H) 08/17/2019         Note: Above Lab results reviewed.  Recent Imaging Review  DG PAIN CLINIC C-ARM 1-60 MIN NO REPORT Fluoro was used, but no Radiologist interpretation will be provided.  Please refer to "NOTES" tab for provider progress note. Note: Reviewed        Physical Exam  General appearance: Well nourished, well developed, and well hydrated. In no apparent acute distress Mental status: Alert, oriented x 3 (person, place, & time)       Respiratory: No evidence of acute respiratory distress Eyes: PERLA Vitals: There were no vitals taken for this visit. BMI: Estimated body mass index is 33.56 kg/m as calculated from the following:   Height as of 05/22/21: _0  (1.626 m).   Weight as of 05/22/21: 195 lb 8 oz (88.7 kg). Ideal: Patient weight not recorded  Assessment   Diagnosis Status  No diagnosis found. Controlled Controlled Controlled   Updated Problems: No problems updated.  Plan of Care  Problem-specific:  No problem-specific Assessment & Plan notes found for this encounter.  [name] has a current medication list which includes the following long-term medication(s): albuterol, allopurinol, bupropion, colchicine, duloxetine, and olmesartan-amlodipine-hctz.  Pharmacotherapy (Medications Ordered): No orders of the defined types were placed in this encounter.  Orders:  No  orders of the defined types were placed in this encounter.  Follow-up plan:   No follow-ups on file.     Interventional treatment options: Planned, scheduled, and/or pending:   Therapeutic left-sided lumbar facet RFA #1    Under consideration:   Diagnostic bilateral lumbar facet block #2  Therapeutic bilateral intra-articular L4-5 facet injection  Possible bilateral lumbar facet RFA  Diagnostic left SI joint block  Possible left SI joint RFA  Diagnostic right-sided L2-3 LESI  Diagnostic right-sided L3-4 LESI  Diagnostic right-sided L4-5 LESI  Diagnostic bilateral L1 TFESI  Diagnostic bilateral L2 TFESI  Diagnostic bilateral L3 TFESI  Diagnostic bilateral L4 TFESI  Diagnostic bilateral L5 TFESI    Therapeutic/palliative (PRN):   None at this time      Recent Visits No visits were found meeting these conditions. Showing recent visits within past 90 days and meeting all other requirements Future Appointments Date Type Provider Dept  10/24/21 Appointment Milinda Pointer, MD [facility]  Showing future appointments within next 90 days and meeting all other requirements  I discussed the assessment and treatment plan with the patient. The patient was provided an opportunity to ask questions and all were answered. The patient agreed with the plan and demonstrated an understanding of the instructions.  Patient advised to call back or seek an in-person evaluation if the symptoms or condition worsens.  Duration of encounter: *** minutes.  Note by: Tonya Cola, MD Date: 10/24/2021; Time: 3:14 PM

## 2021-10-24 ENCOUNTER — Encounter: Payer: Self-pay | Admitting: Pain Medicine

## 2021-10-24 ENCOUNTER — Ambulatory Visit: Payer: Managed Care, Other (non HMO) | Attending: Pain Medicine | Admitting: Pain Medicine

## 2021-10-24 VITALS — BP 117/78 | HR 95 | Temp 98.4°F | Resp 16 | Ht 63.0 in | Wt 197.0 lb

## 2021-10-24 DIAGNOSIS — R937 Abnormal findings on diagnostic imaging of other parts of musculoskeletal system: Secondary | ICD-10-CM | POA: Insufficient documentation

## 2021-10-24 DIAGNOSIS — M4310 Spondylolisthesis, site unspecified: Secondary | ICD-10-CM | POA: Insufficient documentation

## 2021-10-24 DIAGNOSIS — M47817 Spondylosis without myelopathy or radiculopathy, lumbosacral region: Secondary | ICD-10-CM | POA: Diagnosis present

## 2021-10-24 DIAGNOSIS — M47816 Spondylosis without myelopathy or radiculopathy, lumbar region: Secondary | ICD-10-CM | POA: Insufficient documentation

## 2021-10-24 DIAGNOSIS — M545 Low back pain, unspecified: Secondary | ICD-10-CM | POA: Insufficient documentation

## 2021-10-24 DIAGNOSIS — G8929 Other chronic pain: Secondary | ICD-10-CM | POA: Insufficient documentation

## 2021-10-24 DIAGNOSIS — M5137 Other intervertebral disc degeneration, lumbosacral region: Secondary | ICD-10-CM | POA: Insufficient documentation

## 2021-10-24 NOTE — Progress Notes (Signed)
Safety precautions to be maintained throughout the outpatient stay will include: orient to surroundings, keep bed in low position, maintain call bell within reach at all times, provide assistance with transfer out of bed and ambulation.  

## 2021-10-24 NOTE — Patient Instructions (Signed)
____________________________________________________________________________________________  Pain Prevention Technique  Definition:   A technique used to minimize the effects of an activity known to cause inflammation or swelling, which in turn leads to an increase in pain.  Purpose: To prevent swelling from occurring. It is based on the fact that it is easier to prevent swelling from happening than it is to get rid of it, once it occurs.  Contraindications: Anyone with allergy or hypersensitivity to the recommended medications. Anyone taking anticoagulants (Blood Thinners) (e.g., Coumadin, Warfarin, Plavix, etc.). Patients in Renal Failure.  Technique: Before you undertake an activity known to cause pain, or a flare-up of your chronic pain, and before you experience any pain, do the following:  On a full stomach, take 4 (four) over the counter Ibuprofens 200mg tablets (Motrin), for a total of 800 mg. In addition, take over the counter Magnesium 400 to 500 mg, before doing the activity.  Six (6) hours later, again on a full stomach, repeat the Ibuprofen. That night, take a warm shower and stretch under the running warm water.  This technique may be sufficient to abort the pain and discomfort before it happens. Keep in mind that it takes a lot less medication to prevent swelling than it takes to eliminate it once it occurs.  ____________________________________________________________________________________________   

## 2021-12-03 ENCOUNTER — Telehealth: Payer: Self-pay | Admitting: Pain Medicine

## 2021-12-03 NOTE — Telephone Encounter (Signed)
Note written and patient notified. 

## 2021-12-03 NOTE — Telephone Encounter (Signed)
Patient wants to see if Dr. Laban Emperor will  give her a letter stating she needs to stand on an anti stress matt at work for her pain. Her work will not let her use one without a letter from him. Please advise patient

## 2022-03-18 ENCOUNTER — Other Ambulatory Visit: Payer: Self-pay | Admitting: Internal Medicine

## 2022-03-19 NOTE — Telephone Encounter (Signed)
Requested Prescriptions  Pending Prescriptions Disp Refills  . albuterol (VENTOLIN HFA) 108 (90 Base) MCG/ACT inhaler [Pharmacy Med Name: ALBUTEROL HFA INH (200 PUFFS) 6.7GM] 6.7 g 2    Sig: INHALE 2 PUFFS BY MOUTH EVERY 6 HOURS AS NEEDED FOR WHEEZING     Pulmonology:  Beta Agonists 2 Passed - 03/18/2022  8:52 AM      Passed - Last BP in normal range    BP Readings from Last 1 Encounters:  10/24/21 117/78         Passed - Last Heart Rate in normal range    Pulse Readings from Last 1 Encounters:  10/24/21 95         Passed - Valid encounter within last 12 months    Recent Outpatient Visits          10 months ago Hypertension, unspecified type   Tonya Robertson, [place]

## 2022-05-16 ENCOUNTER — Other Ambulatory Visit: Payer: Self-pay | Admitting: Internal Medicine

## 2022-05-17 NOTE — Telephone Encounter (Signed)
Requested Prescriptions  Pending Prescriptions Disp Refills   albuterol (VENTOLIN HFA) 108 (90 Base) MCG/ACT inhaler [Pharmacy Med Name: ALBUTEROL HFA INH (200 PUFFS) 6.7GM] 6.7 g 0    Sig: INHALE 2 PUFFS BY MOUTH EVERY 6 HOURS AS NEEDED FOR WHEEZING     Pulmonology:  Beta Agonists 2 Passed - 05/16/2022  1:33 PM      Passed - Last BP in normal range    BP Readings from Last 1 Encounters:  10/24/21 117/78         Passed - Last Heart Rate in normal range    Pulse Readings from Last 1 Encounters:  10/24/21 95         Passed - Valid encounter within last 12 months    Recent Outpatient Visits           12 months ago Hypertension, unspecified type   [facility] Margarita Mail, [place]

## 2022-07-22 ENCOUNTER — Other Ambulatory Visit: Payer: Self-pay | Admitting: Internal Medicine

## 2022-07-23 NOTE — Telephone Encounter (Signed)
Requested medication (s) are due for refill today: yes  Requested medication (s) are on the active medication list: yes    Last refill: 05/17/22     6.7G  0 refills  Future visit scheduled no  Notes to clinic:Pt needs appt, attempted to reach, left VM to call back to secure appt  Requested Prescriptions  Pending Prescriptions Disp Refills   albuterol (VENTOLIN HFA) 108 (90 Base) MCG/ACT inhaler [Pharmacy Med Name: ALBUTEROL HFA INH (200 PUFFS) 6.7GM] 6.7 g 0    Sig: INHALE 2 PUFFS BY MOUTH EVERY 6 HOURS AS NEEDED FOR WHEEZING     Pulmonology:  Beta Agonists 2 Failed - 07/22/2022 10:02 AM      Failed - Valid encounter within last 12 months    Recent Outpatient Visits           1 year ago Hypertension, unspecified type   [place], DO              Passed - Last BP in normal range    BP Readings from Last 1 Encounters:  10/24/21 117/78         Passed - Last Heart Rate in normal range    Pulse Readings from Last 1 Encounters:  10/24/21 95

## 2022-08-19 ENCOUNTER — Encounter: Payer: Self-pay | Admitting: *Deleted

## 2022-08-21 ENCOUNTER — Telehealth: Payer: Self-pay | Admitting: *Deleted

## 2022-08-21 ENCOUNTER — Other Ambulatory Visit: Payer: Self-pay | Admitting: *Deleted

## 2022-08-21 DIAGNOSIS — Z1211 Encounter for screening for malignant neoplasm of colon: Secondary | ICD-10-CM

## 2022-08-21 MED ORDER — NA SULFATE-K SULFATE-MG SULF 17.5-3.13-1.6 GM/177ML PO SOLN: 1.0000 | Freq: Once | ORAL | 0 refills | Status: AC

## 2022-08-21 NOTE — Telephone Encounter (Signed)
Gastroenterology Pre-Procedure Review  Request Date: 09/09/2022 Requesting Physician: Dr. Marius Ditch  PATIENT REVIEW QUESTIONS: The patient responded to the following health history questions as indicated:    1. Are you having any GI issues? no 2. Do you have a personal history of Polyps? no 3. Do you have a family history of Colon Cancer or Polyps? no 4. Diabetes Mellitus? no 5. Joint replacements in the past 12 months?no 6. Major health problems in the past 3 months?no 7. Any artificial heart valves, MVP, or defibrillator?no    MEDICATIONS & ALLERGIES:    Patient reports the following regarding taking any anticoagulation/antiplatelet therapy:   Plavix, Coumadin, Eliquis, Xarelto, Lovenox, Pradaxa, Brilinta, or Effient? no Aspirin? no  Patient confirms/reports the following medications:  Current Outpatient Medications  Medication Sig Dispense Refill   Na Sulfate-K Sulfate-Mg Sulf 17.5-3.13-1.6 GM/177ML SOLN Take 1 kit by mouth once for 1 dose. 354 mL 0   albuterol (VENTOLIN HFA) 108 (90 Base) MCG/ACT inhaler INHALE 2 PUFFS BY MOUTH EVERY 6 HOURS AS NEEDED FOR WHEEZING 6.7 g 0   allopurinol (ZYLOPRIM) 300 MG tablet allopurinol 300 mg tablet  TAKE 1 TABLET BY MOUTH EVERY DAY 90 tablet 3   buPROPion (WELLBUTRIN XL) 300 MG 24 hr tablet Take 1 tablet (300 mg total) by mouth daily. 90 tablet 3   colchicine 0.6 MG tablet colchicine 0.6 mg tablet     DULoxetine (CYMBALTA) 20 MG capsule Take 1 capsule (20 mg total) by mouth daily. (Patient taking differently: Take 30 mg by mouth daily.) 30 capsule 1   hydrOXYzine (ATARAX) 50 MG tablet Take 1 tablet (50 mg total) by mouth every 8 (eight) hours as needed for anxiety. hydroxyzine HCl 50 mg tablet, take as needed for anxiety. 30 tablet 0   Olmesartan-amLODIPine-HCTZ 40-10-25 MG TABS Take 1 tablet by mouth daily. 90 tablet 3   Semaglutide-Weight Management (WEGOVY) 1 MG/0.5ML SOAJ Inject 1 mg into the skin once a week. 2 mL 3   No current  facility-administered medications for this visit.    Patient confirms/reports the following allergies:  No Known Allergies  No orders of the defined types were placed in this encounter.   AUTHORIZATION INFORMATION Primary Insurance: 1D#: Group #:  Secondary Insurance: 1D#: Group #:  SCHEDULE INFORMATION: Date: 09/09/2022 Time: Location:  [place]

## 2022-08-26 ENCOUNTER — Other Ambulatory Visit: Payer: Self-pay | Admitting: Internal Medicine

## 2022-08-26 DIAGNOSIS — I10 Essential (primary) hypertension: Secondary | ICD-10-CM

## 2022-08-26 NOTE — Telephone Encounter (Signed)
Requested medications are due for refill today.  yes  Requested medications are on the active medications list.  yes  Last refill. 05/22/2021 #90 3 rf  Future visit scheduled.   no  Notes to clinic.  Pt is more than 3 months overdue for OV. Labs are expired.    Requested Prescriptions  Pending Prescriptions Disp Refills   Olmesartan-amLODIPine-HCTZ 40-10-25 MG TABS [Pharmacy Med Name: OLMESARTAN/AMLOD/HCTZ 40-10-25MG  T] 90 tablet 3    Sig: Take 1 tablet by mouth daily.     Cardiovascular: CCB + ARB + Diuretic Combos Failed - 08/26/2022  9:32 AM      Failed - K in normal range and within 180 days    Potassium  Date Value Ref Range Status  08/17/2019 4.0 3.5 - 5.2 mmol/L Final         Failed - Na in normal range and within 180 days    Sodium  Date Value Ref Range Status  08/17/2019 139 134 - 144 mmol/L Final         Failed - Cr in normal range and within 180 days    Creatinine, Ser  Date Value Ref Range Status  08/17/2019 0.87 0.57 - 1.00 mg/dL Final         Failed - eGFR is 10 or above and within 180 days    GFR calc Af Amer  Date Value Ref Range Status  08/17/2019 89 >59 mL/min/1.73 Final   GFR calc non Af Amer  Date Value Ref Range Status  08/17/2019 77 >59 mL/min/1.73 Final         Failed - Valid encounter within last 6 months    Recent Outpatient Visits           1 year ago Hypertension, unspecified type   [facility] Teodora Medici, [place] - Patient is not pregnant      Passed - Last BP in normal range    BP Readings from Last 1 Encounters:  10/24/21 117/78         Passed - Last Heart Rate in normal range    Pulse Readings from Last 1 Encounters:  10/24/21 95

## 2022-09-02 ENCOUNTER — Telehealth: Payer: Self-pay | Admitting: Gastroenterology

## 2022-09-02 MED ORDER — SUTAB 1479-225-188 MG PO TABS: ORAL_TABLET | ORAL | 0 refills | Status: DC

## 2022-09-02 NOTE — Telephone Encounter (Signed)
Spoken to patient and send Sutab as requested.  New instructions will be sent and available on MyChart.

## 2022-09-02 NOTE — Telephone Encounter (Signed)
Patient calling stating that someone had told her she could do a capsule for her bowel prep. Patient requesting a call back to see if she can switch her bowel prep.

## 2022-09-02 NOTE — Addendum Note (Signed)
Addended by: Tawnya Crook on: 09/02/2022 02:51 PM   Modules accepted: Orders

## 2022-09-06 ENCOUNTER — Encounter: Payer: Self-pay | Admitting: Gastroenterology

## 2022-09-09 ENCOUNTER — Ambulatory Visit
Admission: RE | Admit: 2022-09-09 | Discharge: 2022-09-09 | Disposition: A | Discharge status: Home / Self Care | Payer: Managed Care, Other (non HMO) | Attending: Gastroenterology | Admitting: Gastroenterology

## 2022-09-09 ENCOUNTER — Encounter: Payer: Self-pay | Admitting: Gastroenterology

## 2022-09-09 ENCOUNTER — Ambulatory Visit: Payer: Managed Care, Other (non HMO) | Admitting: Certified Registered Nurse Anesthetist

## 2022-09-09 ENCOUNTER — Encounter
Admission: RE | Disposition: A | Discharge status: Home / Self Care | Payer: Self-pay | Source: Home / Self Care | Attending: Gastroenterology

## 2022-09-09 ENCOUNTER — Other Ambulatory Visit: Payer: Self-pay

## 2022-09-09 DIAGNOSIS — Z79899 Other long term (current) drug therapy: Secondary | ICD-10-CM | POA: Insufficient documentation

## 2022-09-09 DIAGNOSIS — Z1211 Encounter for screening for malignant neoplasm of colon: Secondary | ICD-10-CM | POA: Diagnosis present

## 2022-09-09 DIAGNOSIS — I10 Essential (primary) hypertension: Secondary | ICD-10-CM | POA: Diagnosis not present

## 2022-09-09 DIAGNOSIS — F17210 Nicotine dependence, cigarettes, uncomplicated: Secondary | ICD-10-CM | POA: Insufficient documentation

## 2022-09-09 DIAGNOSIS — K635 Polyp of colon: Secondary | ICD-10-CM | POA: Insufficient documentation

## 2022-09-09 DIAGNOSIS — K5730 Diverticulosis of large intestine without perforation or abscess without bleeding: Secondary | ICD-10-CM | POA: Insufficient documentation

## 2022-09-09 DIAGNOSIS — D124 Benign neoplasm of descending colon: Secondary | ICD-10-CM

## 2022-09-09 DIAGNOSIS — F32A Depression, unspecified: Secondary | ICD-10-CM | POA: Diagnosis not present

## 2022-09-09 DIAGNOSIS — D120 Benign neoplasm of cecum: Secondary | ICD-10-CM

## 2022-09-09 DIAGNOSIS — D123 Benign neoplasm of transverse colon: Secondary | ICD-10-CM | POA: Insufficient documentation

## 2022-09-09 HISTORY — PX: COLONOSCOPY WITH PROPOFOL: SHX5780

## 2022-09-09 LAB — HM MAMMOGRAPHY

## 2022-09-09 SURGERY — COLONOSCOPY WITH PROPOFOL
Anesthesia: General

## 2022-09-09 MED ORDER — GLYCOPYRROLATE 0.2 MG/ML IJ SOLN
INTRAMUSCULAR | Status: AC
  Filled 2022-09-09: qty 1

## 2022-09-09 MED ORDER — PROPOFOL 10 MG/ML IV BOLUS
INTRAVENOUS | Status: DC | PRN
  Administered 2022-09-09: 20 mg via INTRAVENOUS
  Administered 2022-09-09: 60 mg via INTRAVENOUS
  Administered 2022-09-09: 10 mg via INTRAVENOUS
  Administered 2022-09-09 (×2): 20 mg via INTRAVENOUS

## 2022-09-09 MED ORDER — DEXMEDETOMIDINE HCL IN NACL 200 MCG/50ML IV SOLN
INTRAVENOUS | Status: DC | PRN
  Administered 2022-09-09: 8 ug via INTRAVENOUS
  Administered 2022-09-09: 4 ug via INTRAVENOUS

## 2022-09-09 MED ORDER — DEXMEDETOMIDINE HCL IN NACL 80 MCG/20ML IV SOLN
INTRAVENOUS | Status: AC
  Filled 2022-09-09: qty 20

## 2022-09-09 MED ORDER — PROPOFOL 500 MG/50ML IV EMUL
INTRAVENOUS | Status: DC | PRN
  Administered 2022-09-09: 150 ug/kg/min via INTRAVENOUS

## 2022-09-09 MED ORDER — PROPOFOL 1000 MG/100ML IV EMUL
INTRAVENOUS | Status: AC
  Filled 2022-09-09: qty 100

## 2022-09-09 MED ORDER — MIDAZOLAM HCL 2 MG/2ML IJ SOLN
INTRAMUSCULAR | Status: AC
  Filled 2022-09-09: qty 2

## 2022-09-09 MED ORDER — SODIUM CHLORIDE 0.9 % IV SOLN: INTRAVENOUS | Status: DC

## 2022-09-09 MED ORDER — LIDOCAINE HCL (CARDIAC) PF 100 MG/5ML IV SOSY
PREFILLED_SYRINGE | INTRAVENOUS | Status: DC | PRN
  Administered 2022-09-09: 50 mg via INTRAVENOUS

## 2022-09-09 MED ORDER — GLYCOPYRROLATE 0.2 MG/ML IJ SOLN
INTRAMUSCULAR | Status: DC | PRN
  Administered 2022-09-09: .2 mg via INTRAVENOUS

## 2022-09-09 NOTE — H&P (Signed)
Arlyss Repress, MD [address]  [address]  [place], [place] [address]  Main: [phone]  Fax: [phone] Pager: [phone]  Primary Care Physician:  Alease Medina, MD Primary Gastroenterologist:  Dr. Arlyss Repress  Pre-Procedure History & Physical: HPI:  Tonya Robertson is a 57 y.o. female is here for an colonoscopy.   Past Medical History:  Diagnosis Date   Hypertension     Past Surgical History:  Procedure Laterality Date   ABDOMINAL HYSTERECTOMY  2008   partial    Prior to Admission medications   Medication Sig Start Date End Date Taking? Authorizing Provider  buPROPion (WELLBUTRIN XL) 300 MG 24 hr tablet Take 1 tablet (300 mg total) by mouth daily. 05/22/21  Yes Margarita Mail, DO  DULoxetine (CYMBALTA) 20 MG capsule Take 1 capsule (20 mg total) by mouth daily. Patient taking differently: Take 30 mg by mouth daily. 05/22/21  Yes Margarita Mail, DO  hydrOXYzine (ATARAX) 50 MG tablet Take 1 tablet (50 mg total) by mouth every 8 (eight) hours as needed for anxiety. hydroxyzine HCl 50 mg tablet, take as needed for anxiety. 05/24/21  Yes Margarita Mail, DO  Olmesartan-amLODIPine-HCTZ 40-10-25 MG TABS Take 1 tablet by mouth daily. 05/22/21  Yes Margarita Mail, DO  albuterol (VENTOLIN HFA) 108 (90 Base) MCG/ACT inhaler INHALE 2 PUFFS BY MOUTH EVERY 6 HOURS AS NEEDED FOR WHEEZING 07/23/22   Margarita Mail, DO  allopurinol (ZYLOPRIM) 300 MG tablet allopurinol 300 mg tablet  TAKE 1 TABLET BY MOUTH EVERY DAY Patient not taking: Reported on 09/09/2022 05/22/21   Margarita Mail, DO  colchicine 0.6 MG tablet colchicine 0.6 mg tablet 07/24/20   [provider]  Semaglutide-Weight Management (WEGOVY) 1 MG/0.5ML SOAJ Inject 1 mg into the skin once a week. 05/22/21   Margarita Mail, DO  Sodium Sulfate-Mag Sulfate-KCl (SUTAB) [phone] MG TABS Take 1 kit by mouth once as instructed 09/02/22   Toney Reil, MD    Allergies as of 08/22/2022    (No Known Allergies)    Family History  Adopted: Yes    Social History   Socioeconomic History   Marital status: Single    Spouse name: Not on file   Number of children: Not on file   Years of education: Not on file   Highest education level: Not on file  Occupational History   Not on file  Tobacco Use   Smoking status: Every Day    Packs/day: 0.50    Years: 30.00    Additional pack years: 0.00    Total pack years: 15.00    Types: Cigarettes   Smokeless tobacco: Never  Vaping Use   Vaping Use: Some days  Substance and Sexual Activity   Alcohol use: Yes    Comment: Occasionally   Drug use: Never   Sexual activity: Not Currently  Other Topics Concern   Not on file  Social History Narrative   Not on file   Social Determinants of Health   Financial Resource Strain: Not on file  Food Insecurity: Not on file  Transportation Needs: Not on file  Physical Activity: Not on file  Stress: Not on file  Social Connections: Not on file  Intimate Partner Violence: Not on file    Review of Systems: See HPI, otherwise negative ROS  Physical Exam: BP 126/79   Pulse 84   Temp (!) 96.7 F (35.9 C) (Temporal)   Resp 16   Ht  (1.626 m)   Wt 89.4 kg  SpO2 96%   BMI 33.81 kg/m  General:   Alert,  pleasant and cooperative in NAD Head:  Normocephalic and atraumatic. Neck:  Supple; no masses or thyromegaly. Lungs:  Clear throughout to auscultation.    Heart:  Regular rate and rhythm. Abdomen:  Soft, nontender and nondistended. Normal bowel sounds, without guarding, and without rebound.   Neurologic:  Alert and  oriented x4;  grossly normal neurologically.  Impression/Plan: Tonya Robertson is here for an colonoscopy to be performed for colon cancer screening  Risks, benefits, limitations, and alternatives regarding  colonoscopy have been reviewed with the patient.  Questions have been answered.  All parties agreeable.   Lannette Donath, MD  09/09/2022, 7:37 AM

## 2022-09-09 NOTE — Anesthesia Preprocedure Evaluation (Addendum)
Anesthesia Evaluation  Patient identified by MRN, date of birth, ID band Patient awake    Reviewed: Allergy & Precautions, NPO status , Patient's Chart, lab work & pertinent test results  History of Anesthesia Complications Negative for: history of anesthetic complications  Airway Mallampati: II  TM Distance: >3 FB Neck ROM: full    Dental no notable dental hx.    Pulmonary Current Smoker   Pulmonary exam normal        Cardiovascular hypertension, On Medications Normal cardiovascular exam     Neuro/Psych  PSYCHIATRIC DISORDERS  Depression    negative neurological ROS     GI/Hepatic negative GI ROS, Neg liver ROS,,,  Endo/Other  negative endocrine ROS    Renal/GU negative Renal ROS  negative genitourinary   Musculoskeletal   Abdominal   Peds  Hematology negative hematology ROS (+)   Anesthesia Other Findings Past Medical History: No date: Hypertension  Past Surgical History: 2008: ABDOMINAL HYSTERECTOMY     Comment:  partial  BMI    Body Mass Index: 33.81 kg/m      Reproductive/Obstetrics negative OB ROS                             Anesthesia Physical Anesthesia Plan  ASA: 2  Anesthesia Plan: General   Post-op Pain Management: Minimal or no pain anticipated   Induction: Intravenous  PONV Risk Score and Plan: Propofol infusion and TIVA  Airway Management Planned: Natural Airway and Nasal Cannula  Additional Equipment:   Intra-op Plan:   Post-operative Plan:   Informed Consent: I have reviewed the patients History and Physical, chart, labs and discussed the procedure including the risks, benefits and alternatives for the proposed anesthesia with the patient or authorized representative who has indicated his/her understanding and acceptance.     Dental Advisory Given  Plan Discussed with: Anesthesiologist, CRNA and Surgeon  Anesthesia Plan Comments: (Patient  consented for risks of anesthesia including but not limited to:  - adverse reactions to medications - risk of airway placement if required - damage to eyes, teeth, lips or other oral mucosa - nerve damage due to positioning  - sore throat or hoarseness - Damage to heart, brain, nerves, lungs, other parts of body or loss of life  Patient voiced understanding.)       Anesthesia Quick Evaluation

## 2022-09-09 NOTE — Anesthesia Postprocedure Evaluation (Signed)
Anesthesia Post Note  Patient: Tonya Robertson  Procedure(s) Performed: COLONOSCOPY WITH PROPOFOL  Patient location during evaluation: Endoscopy Anesthesia Type: General Level of consciousness: awake and alert Pain management: pain level controlled Vital Signs Assessment: post-procedure vital signs reviewed and stable Respiratory status: spontaneous breathing, nonlabored ventilation, respiratory function stable and patient connected to nasal cannula oxygen Cardiovascular status: blood pressure returned to baseline and stable Postop Assessment: no apparent nausea or vomiting Anesthetic complications: no   No notable events documented.   Last Vitals:  Vitals:   09/09/22 0823 09/09/22 0833  BP: 90/64 (!) 94/58  Pulse: 83 84  Resp: 20 18  Temp: (!) 35.7 C   SpO2: 95% 95%    Last Pain:  Vitals:   09/09/22 0833  TempSrc:   PainSc: 0-No pain                 Louie Boston

## 2022-09-09 NOTE — Op Note (Signed)
[facility] Gastroenterology Patient Name: Tonya Robertson Procedure Date: 09/09/2022 6:59 AM MRN: [id number] Account #: [id number] Date of Birth: July 29, 1965 Admit Type: Outpatient Age: 57 Room: [facility] ENDO ROOM 4 Gender: Female Note Status: Finalized Instrument Name: Prentice Docker [id number] Procedure:             Colonoscopy Indications:           Screening for colorectal malignant neoplasm, This is                         the patient's first colonoscopy Providers:             Toney Reil MD, MD Referring MD:          Cheri Kearns Medicines:             General Anesthesia Complications:         No immediate complications. Estimated blood loss: None. Procedure:             Pre-Anesthesia Assessment:                        - Prior to the procedure, a History and Physical was                         performed, and patient medications and allergies were                         reviewed. The patient is competent. The risks and                         benefits of the procedure and the sedation options and                         risks were discussed with the patient. All questions                         were answered and informed consent was obtained.                         Patient identification and proposed procedure were                         verified by the physician, the nurse, the                         anesthesiologist, the anesthetist and the technician                         in the pre-procedure area in the procedure room in the                         endoscopy suite. Mental Status Examination: alert and                         oriented. Airway Examination: normal oropharyngeal                         airway and neck mobility. Respiratory Examination:  clear to auscultation. CV Examination: normal.                         Prophylactic Antibiotics: The patient does not require                         prophylactic antibiotics. Prior  Anticoagulants: The                         patient has taken no anticoagulant or antiplatelet                         agents. ASA Grade Assessment: II - A patient with mild                         systemic disease. After reviewing the risks and                         benefits, the patient was deemed in satisfactory                         condition to undergo the procedure. The anesthesia                         plan was to use general anesthesia. Immediately prior                         to administration of medications, the patient was                         re-assessed for adequacy to receive sedatives. The                         heart rate, respiratory rate, oxygen saturations,                         blood pressure, adequacy of pulmonary ventilation, and                         response to care were monitored throughout the                         procedure. The physical status of the patient was                         re-assessed after the procedure.                        After obtaining informed consent, the colonoscope was                         passed under direct vision. Throughout the procedure,                         the patient's blood pressure, pulse, and oxygen                         saturations were monitored continuously. The  Colonoscope was introduced through the anus and                         advanced to the the cecum, identified by appendiceal                         orifice and ileocecal valve. The colonoscopy was                         performed without difficulty. The patient tolerated                         the procedure well. The quality of the bowel                         preparation was evaluated using the BBPS [facility] Bowel                         Preparation Scale) with scores of: Right Colon = 3,                         Transverse Colon = 3 and Left Colon = 3 (entire mucosa                         seen well with no residual  staining, small fragments                         of stool or opaque liquid). The total BBPS score                         equals 9. The ileocecal valve, appendiceal orifice,                         and rectum were photographed. Findings:      The perianal and digital rectal examinations were normal. Pertinent       negatives include normal sphincter tone and no palpable rectal lesions.      Four sessile polyps were found in the descending colon, transverse colon       and cecum. The polyps were 4 to 5 mm in size. These polyps were removed       with a cold snare. Resection and retrieval were complete. Estimated       blood loss was minimal.      Multiple diverticula were found in the recto-sigmoid colon and sigmoid       colon.      The retroflexed view of the distal rectum and anal verge was normal and       showed no anal or rectal abnormalities. Impression:            - Four 4 to 5 mm polyps in the descending colon, in                         the transverse colon and in the cecum, removed with a                         cold snare. Resected and retrieved.                        -  Diverticulosis in the recto-sigmoid colon and in the                         sigmoid colon.                        - The distal rectum and anal verge are normal on                         retroflexion view. Recommendation:        - Discharge patient to home (with escort).                        - Resume previous diet today.                        - Continue present medications.                        - Await pathology results.                        - Repeat colonoscopy in 3 years for surveillance of                         multiple polyps. Procedure Code(s):     --- Professional ---                        [phone], Colonoscopy, flexible; with removal of                         tumor(s), polyp(s), or other lesion(s) by snare                         technique Diagnosis Code(s):     --- Professional ---                         Z12.11, Encounter for screening for malignant neoplasm                         of colon                        D12.4, Benign neoplasm of descending colon                        D12.3, Benign neoplasm of transverse colon (hepatic                         flexure or splenic flexure)                        D12.0, Benign neoplasm of cecum                        K57.30, Diverticulosis of large intestine without                         perforation or abscess without bleeding CPT copyright 2022 American Medical Association. All rights reserved. The codes documented in this report are preliminary and upon  coder review may  be revised to meet current compliance requirements. Dr. Libby Maw Toney Reil MD, MD 09/09/2022 8:11:14 AM This report has been signed electronically. Number of Addenda: 0 Note Initiated On: 09/09/2022 6:59 AM Scope Withdrawal Time: 0 hours 10 minutes 46 seconds  Total Procedure Duration: 0 hours 14 minutes 23 seconds  Estimated Blood Loss:  Estimated blood loss: none.      [facility]

## 2022-09-09 NOTE — Anesthesia Procedure Notes (Signed)
Procedure Name: MAC Date/Time: 09/09/2022 7:20 AM  Performed by: Hezzie Bump, CRNAPre-anesthesia Checklist: Patient identified, Emergency Drugs available, Suction available and Patient being monitored Patient Re-evaluated:Patient Re-evaluated prior to induction Oxygen Delivery Method: Nasal cannula Induction Type: IV induction Placement Confirmation: positive ETCO2

## 2022-09-09 NOTE — Transfer of Care (Signed)
Immediate Anesthesia Transfer of Care Note  Patient: Caiyah Loven  Procedure(s) Performed: COLONOSCOPY WITH PROPOFOL  Patient Location: Endoscopy Unit  Anesthesia Type:General  Level of Consciousness: drowsy  Airway & Oxygen Therapy: Patient Spontanous Breathing  Post-op Assessment: Report given to RN and Post -op Vital signs reviewed and stable  Post vital signs: Reviewed and stable  Last Vitals:  Vitals Value Taken Time  BP 99/80 09/09/22 0814  Temp    Pulse 83 09/09/22 0814  Resp 10 09/09/22 0814  SpO2 94 % 09/09/22 0814    Last Pain:  Vitals:   09/09/22 0814  TempSrc:   PainSc: Asleep         Complications: No notable events documented.

## 2022-09-10 ENCOUNTER — Encounter: Payer: Self-pay | Admitting: Gastroenterology

## 2022-09-10 LAB — SURGICAL PATHOLOGY

## 2022-09-12 ENCOUNTER — Encounter: Payer: Self-pay | Admitting: Gastroenterology

## 2022-09-20 ENCOUNTER — Other Ambulatory Visit: Payer: Self-pay | Admitting: Internal Medicine

## 2022-09-20 NOTE — Telephone Encounter (Signed)
Requested medication (s) are due for refill today: yes  Requested medication (s) are on the active medication list: yes  Last refill:  07/23/22  Future visit scheduled: no  Notes to clinic:  Unable to refill per protocol, courtesy refill already given, routing for provider approval.      Requested Prescriptions  Pending Prescriptions Disp Refills   albuterol (VENTOLIN HFA) 108 (90 Base) MCG/ACT inhaler [Pharmacy Med Name: ALBUTEROL HFA INH (200 PUFFS) 6.7GM] 6.7 g 0    Sig: INHALE 2 PUFFS BY MOUTH EVERY 6 HOURS AS NEEDED FOR WHEEZING     Pulmonology:  Beta Agonists 2 Failed - 09/20/2022  2:04 PM      Failed - Valid encounter within last 12 months    Recent Outpatient Visits           1 year ago Hypertension, unspecified type   [facility] Margarita Mail, DO              Passed - Last BP in normal range    BP Readings from Last 1 Encounters:  09/09/22 (!) 94/58         Passed - Last Heart Rate in normal range    Pulse Readings from Last 1 Encounters:  09/09/22 84

## 2023-01-03 ENCOUNTER — Telehealth: Payer: Self-pay | Admitting: Family Medicine

## 2023-01-06 ENCOUNTER — Ambulatory Visit (INDEPENDENT_AMBULATORY_CARE_PROVIDER_SITE_OTHER): Payer: Managed Care, Other (non HMO) | Admitting: Family Medicine

## 2023-01-06 ENCOUNTER — Encounter: Payer: Self-pay | Admitting: Family Medicine

## 2023-01-06 DIAGNOSIS — K219 Gastro-esophageal reflux disease without esophagitis: Secondary | ICD-10-CM

## 2023-01-06 DIAGNOSIS — I10 Essential (primary) hypertension: Secondary | ICD-10-CM

## 2023-01-06 DIAGNOSIS — G8929 Other chronic pain: Secondary | ICD-10-CM

## 2023-01-06 DIAGNOSIS — E782 Mixed hyperlipidemia: Secondary | ICD-10-CM

## 2023-01-06 DIAGNOSIS — Z720 Tobacco use: Secondary | ICD-10-CM | POA: Insufficient documentation

## 2023-01-06 DIAGNOSIS — M545 Low back pain, unspecified: Secondary | ICD-10-CM | POA: Diagnosis not present

## 2023-01-06 MED ORDER — SEMAGLUTIDE-WEIGHT MANAGEMENT 1 MG/0.5ML ~~LOC~~ SOAJ: 1.0000 mg | SUBCUTANEOUS | 0 refills | Status: DC

## 2023-01-06 MED ORDER — SEMAGLUTIDE-WEIGHT MANAGEMENT 0.25 MG/0.5ML ~~LOC~~ SOAJ: 0.2500 mg | SUBCUTANEOUS | 0 refills | Status: DC

## 2023-01-06 MED ORDER — SEMAGLUTIDE-WEIGHT MANAGEMENT 0.5 MG/0.5ML ~~LOC~~ SOAJ: 0.5000 mg | SUBCUTANEOUS | 0 refills | Status: DC

## 2023-01-06 MED ORDER — SEMAGLUTIDE-WEIGHT MANAGEMENT 2.4 MG/0.75ML ~~LOC~~ SOAJ: 2.4000 mg | SUBCUTANEOUS | 1 refills | Status: DC

## 2023-01-06 MED ORDER — SEMAGLUTIDE-WEIGHT MANAGEMENT 1.7 MG/0.75ML ~~LOC~~ SOAJ: 1.7000 mg | SUBCUTANEOUS | 0 refills | Status: DC

## 2023-01-06 NOTE — Assessment & Plan Note (Signed)
Occasional symptoms managed with over-the-counter antacids as needed. - Continue current management with over-the-counter antacids as needed.

## 2023-01-06 NOTE — Assessment & Plan Note (Signed)
Well-controlled on Olmesartan/Amlodipine/HCTZ and Fenofibrate. Recent labs show improving cholesterol levels. - Continue current medications. - Monitor for ankle swelling, a known side effect of Amlodipine. Consider compression socks for symptom management. Reassured that this is not harmful at this time.

## 2023-01-06 NOTE — Assessment & Plan Note (Signed)
Reduced to 5-6 cigarettes per day, down from a pack and a half. Currently using vaping as a cessation aid and taking Bupropion. - Encourage continued efforts to quit smoking. - Continue Bupropion.

## 2023-01-06 NOTE — Assessment & Plan Note (Signed)
History of multiple back issues, previously managed with nerve ablation by [facility]. No current specialist follow-up, patient managing pain independently. - No changes to current management plan.

## 2023-01-06 NOTE — Assessment & Plan Note (Signed)
BMI 36 and assoc with HTN and HLD Patient previously on Wegovy with noted weight loss, but discontinued due to backorder and insurance authorization issues. Patient reports weight regain off the medication and wishes to restart. [facility], beginning with 0.25mg  and gradually increasing the dose as tolerated. - Send prescriptions for all doses (0.25mg , 0.5mg , 1mg , 1.7mg . 2.4mg ) to [facility]. - Follow-up in three months to assess response and tolerability.

## 2023-01-06 NOTE — Progress Notes (Signed)
New Patient Office Visit  Subjective    Patient ID: Tonya Robertson, female    DOB: [birth year]  Age: 57 y.o. MRN: [id number]  CC:  Chief Complaint  Patient presents with   New Patient (Initial Visit)    Patient transferring Dr. Girtha Rm, Lyn Hollingshead, MD.  Patient requesting refill on [phone]. She reports good compliance and tolerance to medication. She reports 20 pounds of weight loss when using medication. She reports she has been off Wegovy x 4 months or longer.     HPI Tonya Robertson presents to establish care  Discussed the use of AI scribe software for clinical note transcription with the patient, who gave verbal consent to proceed.  History of Present Illness   The patient, with a history of arthritis, back pain, GERD, hyperlipidemia, and hypertension, presents to establish care with a desire to restart [facility] for weight loss. They report that they had previously been on the medication, but had to stop due to a backorder. They had only worked up to a dose of 1mg , as higher doses caused nausea. They express frustration with the process of obtaining prior authorization for the medication.  The patient also reports a recent episode of shingles, which they found distressing. They have a history of smoking, but have reduced their intake to 5-6 cigarettes per day and are trying to quit using a vape. They are also on bupropion, which was initially prescribed to aid in smoking cessation, but they have continued to take it for its mood-stabilizing effects.  The patient has been managing their back pain with nerve ablation treatments from a pain clinic, but currently does not see any specialists for this issue. They manage their GERD with over-the-counter medications as needed. Their hyperlipidemia and hypertension are managed with a combination of Olmesartan, amlodipine, HCTZ, and fenofibrate. They report that their cholesterol levels have been improving.  The patient also mentions some recent  swelling in their ankles. They are not currently taking any measures to manage this symptom.       Outpatient Encounter Medications as of 01/06/2023  Medication Sig   albuterol (VENTOLIN HFA) 108 (90 Base) MCG/ACT inhaler INHALE 2 PUFFS BY MOUTH EVERY 6 HOURS AS NEEDED FOR WHEEZING   buPROPion (WELLBUTRIN XL) 300 MG 24 hr tablet Take 1 tablet (300 mg total) by mouth daily.   colchicine 0.6 MG tablet Take 0.6 mg by mouth as needed.   DULoxetine (CYMBALTA) 20 MG capsule Take 1 capsule (20 mg total) by mouth daily. (Patient taking differently: Take 60 mg by mouth daily.)   fenofibrate (TRICOR) 48 MG tablet Take 48 mg by mouth daily.   hydrOXYzine (ATARAX) 50 MG tablet Take 1 tablet (50 mg total) by mouth every 8 (eight) hours as needed for anxiety. hydroxyzine HCl 50 mg tablet, take as needed for anxiety.   Olmesartan-amLODIPine-HCTZ 40-10-25 MG TABS Take 1 tablet by mouth daily.   Semaglutide-Weight Management 0.25 MG/0.5ML SOAJ Inject 0.25 mg into the skin once a week for 28 days.   [START ON 02/04/2023] Semaglutide-Weight Management 0.5 MG/0.5ML SOAJ Inject 0.5 mg into the skin once a week for 28 days.   [START ON 03/05/2023] Semaglutide-Weight Management 1 MG/0.5ML SOAJ Inject 1 mg into the skin once a week for 28 days.   [START ON 04/03/2023] Semaglutide-Weight Management 1.7 MG/0.75ML SOAJ Inject 1.7 mg into the skin once a week for 28 days.   [START ON 05/02/2023] Semaglutide-Weight Management 2.4 MG/0.75ML SOAJ Inject 2.4 mg into the skin once a week.   [  DISCONTINUED] Semaglutide-Weight Management (WEGOVY) 1 MG/0.5ML SOAJ Inject 1 mg into the skin once a week. (Patient not taking: Reported on 01/06/2023)   No facility-administered encounter medications on file as of 01/06/2023.    Past Medical History:  Diagnosis Date   Arthritis    GERD (gastroesophageal reflux disease)    Hyperlipidemia    Hypertension     Past Surgical History:  Procedure Laterality Date   ABDOMINAL HYSTERECTOMY   2008   partial   COLONOSCOPY WITH PROPOFOL N/A 09/09/2022   Procedure: COLONOSCOPY WITH PROPOFOL;  Surgeon: Toney Reil, MD;  Location: [facility] ENDOSCOPY;  Service: Gastroenterology;  Laterality: N/A;   lumbar ablation      Family History  Adopted: Yes    Social History   Socioeconomic History   Marital status: Single    Spouse name: Not on file   Number of children: 0   Years of education: Not on file   Highest education level: Not on file  Occupational History   Not on file  Tobacco Use   Smoking status: Every Day    Current packs/day: 0.25    Average packs/day: 0.3 packs/day for 30.0 years (7.5 ttl pk-yrs)    Types: Cigarettes   Smokeless tobacco: Never  Vaping Use   Vaping status: Some Days  Substance and Sexual Activity   Alcohol use: Yes    Comment: Occasionally   Drug use: Never   Sexual activity: Not Currently  Other Topics Concern   Not on file  Social History Narrative   Not on file   Social Determinants of Health   Financial Resource Strain: Not on file  Food Insecurity: Not on file  Transportation Needs: Not on file  Physical Activity: Not on file  Stress: Not on file  Social Connections: Not on file  Intimate Partner Violence: Not on file    ROS      Objective    BP (!) 113/51 (BP Location: Right Arm, Patient Position: Sitting, Cuff Size: Large)   Pulse 87   Temp 97.6 F (36.4 C) (Temporal)   Resp 12   Ht 5\' 4"  (1.626 m)   Wt 209 lb 14.4 oz (95.2 kg)   BMI 36.03 kg/m   Physical Exam Vitals reviewed.  Constitutional:      General: She is not in acute distress.    Appearance: She is well-developed.  HENT:     Head: Normocephalic and atraumatic.  Eyes:     General: No scleral icterus.    Conjunctiva/sclera: Conjunctivae normal.  Cardiovascular:     Rate and Rhythm: Normal rate and regular rhythm.     Heart sounds: Normal heart sounds. No murmur heard. Pulmonary:     Effort: Pulmonary effort is normal. No respiratory  distress.  Skin:    General: Skin is warm and dry.     Findings: No rash.  Neurological:     Mental Status: She is alert and oriented to person, place, and time.  Psychiatric:        Behavior: Behavior normal.         Assessment & Plan:   Problem List Items Addressed This Visit       Cardiovascular and Mediastinum   Hypertensive disorder    Well-controlled on Olmesartan/Amlodipine/HCTZ and Fenofibrate. Recent labs show improving cholesterol levels. - Continue current medications. - Monitor for ankle swelling, a known side effect of Amlodipine. Consider compression socks for symptom management. Reassured that this is not harmful at this time.  Relevant Medications   fenofibrate (TRICOR) 48 MG tablet     Digestive   GERD (gastroesophageal reflux disease)    Occasional symptoms managed with over-the-counter antacids as needed. - Continue current management with over-the-counter antacids as needed.        Other   Chronic low back pain (1ry area of Pain) (Bilateral) (R>L) w/o sciatica (Chronic)    History of multiple back issues, previously managed with nerve ablation by [facility]. No current specialist follow-up, patient managing pain independently. - No changes to current management plan.      Mixed hyperlipidemia    Well-controlled on Olmesartan/Amlodipine/HCTZ and Fenofibrate. Recent labs show improving cholesterol levels. - Continue current medications. - Monitor for ankle swelling, a known side effect of Amlodipine. Consider compression socks for symptom management. Reassured that this is not harmful at this time.      Relevant Medications   fenofibrate (TRICOR) 48 MG tablet   Morbid obesity (HCC) - Primary    BMI 36 and assoc with HTN and HLD Patient previously on Wegovy with noted weight loss, but discontinued due to backorder and insurance authorization issues. Patient reports weight regain off the medication and wishes to restart. [facility],  beginning with 0.25mg  and gradually increasing the dose as tolerated. - Send prescriptions for all doses (0.25mg , 0.5mg , 1mg , 1.7mg . 2.4mg ) to [facility]. - Follow-up in three months to assess response and tolerability.      Relevant Medications   Semaglutide-Weight Management 0.25 MG/0.5ML SOAJ   Semaglutide-Weight Management 0.5 MG/0.5ML SOAJ (Start on 02/04/2023)   Semaglutide-Weight Management 1 MG/0.5ML SOAJ (Start on 03/05/2023)   Semaglutide-Weight Management 1.7 MG/0.75ML SOAJ (Start on 04/03/2023)   Semaglutide-Weight Management 2.4 MG/0.75ML SOAJ (Start on 05/02/2023)   Tobacco use    Reduced to 5-6 cigarettes per day, down from a pack and a half. Currently using vaping as a cessation aid and taking Bupropion. - Encourage continued efforts to quit smoking. - Continue Bupropion.         Return in about 3 months (around 04/08/2023) for weight f/u.   Shirlee Latch, MD

## 2023-01-14 ENCOUNTER — Telehealth: Payer: Self-pay | Admitting: Family Medicine

## 2023-01-14 MED ORDER — SEMAGLUTIDE-WEIGHT MANAGEMENT 0.5 MG/0.5ML ~~LOC~~ SOAJ: 0.5000 mg | SUBCUTANEOUS | 0 refills | Status: DC

## 2023-01-14 NOTE — Telephone Encounter (Signed)
That's fine. She may experience a little more nausea.

## 2023-01-14 NOTE — Telephone Encounter (Signed)
PA started

## 2023-01-14 NOTE — Telephone Encounter (Signed)
[facility] pharmacy is requesting prior authorization Key: [id number] Name: Tonya Robertson 0.5mg /0.5ML auto injectors

## 2023-01-16 NOTE — Telephone Encounter (Signed)
Noted. Please let patient know.

## 2023-01-16 NOTE — Telephone Encounter (Signed)
Sent message through mychart

## 2023-01-16 NOTE — Telephone Encounter (Signed)
Your prior authorization request has been denied. COMPLETE E-APPEAL Your request for prior authorization was denied, but an Electronic Appeal is available for your patient. Complete the questions in the Appeal section at the bottom of this page to pursue the appeal. For assistance, contact our support team at [phone].  Message from plan: Request Reference Number: [id number]. WEGOVY INJ 0.5MG  is denied for not meeting the prior authorization requirement(s). Details of this decision are in the notice attached below or have been faxed to you.

## 2023-01-17 NOTE — Telephone Encounter (Signed)
We can do a virtual or in person visit to discuss weight loss med options if she'd like

## 2023-01-24 ENCOUNTER — Other Ambulatory Visit: Payer: Self-pay | Admitting: Internal Medicine

## 2023-01-24 NOTE — Telephone Encounter (Signed)
Requested Prescriptions  Pending Prescriptions Disp Refills   albuterol (VENTOLIN HFA) 108 (90 Base) MCG/ACT inhaler [Pharmacy Med Name: ALBUTEROL HFA INH (200 PUFFS) 6.7GM] 6.7 g 0    Sig: INHALE 2 PUFFS BY MOUTH EVERY 6 HOURS AS NEEDED FOR WHEEZING     Pulmonology:  Beta Agonists 2 Passed - 01/24/2023  1:10 PM      Passed - Last BP in normal range    BP Readings from Last 1 Encounters:  01/06/23 (!) 113/51         Passed - Last Heart Rate in normal range    Pulse Readings from Last 1 Encounters:  01/06/23 87         Passed - Valid encounter within last 12 months    Recent Outpatient Visits           2 weeks ago Morbid obesity [facility])   [place] [facility], Marzella Schlein, MD   1 year ago Hypertension, unspecified type   [facility] Margarita Mail, DO       Future Appointments             In 4 days Bacigalupo, Marzella Schlein, MD [facility], [facility]

## 2023-01-28 ENCOUNTER — Encounter: Payer: Self-pay | Admitting: Family Medicine

## 2023-01-28 ENCOUNTER — Telehealth: Payer: Self-pay | Admitting: Family Medicine

## 2023-01-28 ENCOUNTER — Ambulatory Visit (INDEPENDENT_AMBULATORY_CARE_PROVIDER_SITE_OTHER): Payer: Managed Care, Other (non HMO) | Admitting: Family Medicine

## 2023-01-28 DIAGNOSIS — M19049 Primary osteoarthritis, unspecified hand: Secondary | ICD-10-CM | POA: Diagnosis not present

## 2023-01-28 DIAGNOSIS — I10 Essential (primary) hypertension: Secondary | ICD-10-CM | POA: Diagnosis not present

## 2023-01-28 MED ORDER — TIRZEPATIDE-WEIGHT MANAGEMENT 5 MG/0.5ML ~~LOC~~ SOAJ: 5.0000 mg | SUBCUTANEOUS | 1 refills | Status: DC

## 2023-01-28 MED ORDER — TIRZEPATIDE-WEIGHT MANAGEMENT 2.5 MG/0.5ML ~~LOC~~ SOAJ: 2.5000 mg | SUBCUTANEOUS | 0 refills | Status: DC

## 2023-01-28 MED ORDER — OLMESARTAN MEDOXOMIL-HCTZ 40-25 MG PO TABS: 1.0000 | ORAL_TABLET | Freq: Every day | ORAL | 1 refills | Status: DC

## 2023-01-28 MED ORDER — METOPROLOL SUCCINATE ER 25 MG PO TB24: 25.0000 mg | ORAL_TABLET | Freq: Every day | ORAL | 3 refills | Status: DC

## 2023-01-28 NOTE — Telephone Encounter (Signed)
[facility] pharmacy is requesting prior authorization Key: [id number] Name: Tonya Robertson Zepbound 2.5 MG/0.5ML auto injectors

## 2023-01-28 NOTE — Progress Notes (Signed)
Established Patient Office Visit  Subjective   Patient ID: Tonya Robertson, female    DOB: [birth year]  Age: 57 y.o. MRN: [id number]  Chief Complaint  Patient presents with   Medical Management of Chronic Issues    Patient here to talk about starting a weight loss medication. Insurance did not approve her Reginal Lutes, even after she joined [employer].     HPI  Discussed the use of AI scribe software for clinical note transcription with the patient, who gave verbal consent to proceed.  History of Present Illness   The patient, with a history of high blood pressure and arthritis, presents with concerns about weight gain and leg swelling. They have been trying to get approval for [facility] for weight loss, but have been denied by their insurance due to recent weight gain. The patient attributes this weight gain to the unavailability of lower doses of Wegovy. They have also joined [employer] in an attempt to manage their weight.  In addition to their weight concerns, the patient has been experiencing leg swelling, which they believe is a side effect of their blood pressure medication. The swelling is not present at the time of the appointment, but the patient reports that it gets worse throughout the day, especially when they are working. The patient also mentions that they have arthritis, particularly in their thumb, which has been causing them significant discomfort.         ROS per HPI    Objective:     BP 116/71 (BP Location: Left Arm, Patient Position: Sitting, Cuff Size: Large)   Pulse 84   Temp 97.6 F (36.4 C) (Temporal)   Resp 16   Ht 5\' 4"  (1.626 m)   Wt 209 lb (94.8 kg)   BMI 35.87 kg/m    Physical Exam Vitals reviewed.  Constitutional:      General: She is not in acute distress.    Appearance: Normal appearance. She is well-developed. She is not diaphoretic.  HENT:     Head: Normocephalic and atraumatic.  Eyes:     General: No scleral icterus.     Conjunctiva/sclera: Conjunctivae normal.  Neck:     Thyroid: No thyromegaly.  Cardiovascular:     Rate and Rhythm: Normal rate and regular rhythm.     Heart sounds: Normal heart sounds. No murmur heard. Pulmonary:     Effort: Pulmonary effort is normal. No respiratory distress.     Breath sounds: Normal breath sounds. No wheezing, rhonchi or rales.  Musculoskeletal:     Cervical back: Neck supple.     Right lower leg: No edema.     Left lower leg: No edema.  Lymphadenopathy:     Cervical: No cervical adenopathy.  Skin:    General: Skin is warm and dry.     Findings: No rash.  Neurological:     Mental Status: She is alert and oriented to person, place, and time. Mental status is at baseline.  Psychiatric:        Mood and Affect: Mood normal.        Behavior: Behavior normal.    No results found for any visits on 01/28/23.    The ASCVD Risk score (Arnett DK, et al., 2019) failed to calculate for the following reasons:   Cannot find a previous HDL lab   Cannot find a previous total cholesterol lab    Assessment & Plan:   Problem List Items Addressed This Visit       Cardiovascular  and Mediastinum   Hypertensive disorder    Well controlled, but patient reports leg swelling potentially related to amlodipine. -Discontinue amlodipine and add metoprolol 25mg  daily. -Continue Olmesartan and HCTZ at current doses.      Relevant Medications   olmesartan-hydrochlorothiazide (BENICAR HCT) 40-25 MG tablet   metoprolol succinate (TOPROL-XL) 25 MG 24 hr tablet     Musculoskeletal and Integument   CMC arthritis    Patient reports pain in R CMC joint, affecting ability to open jars. -Refer to orthopedics for potential steroid injection.      Relevant Orders   Ambulatory referral to Orthopedic Surgery     Other   Morbid obesity (HCC) - Primary    Insurance denied coverage for [facility] due to recent weight gain, but patient wasn't able to obtain the medication due to backorder.  Patient has been on Weight Watchers for a week. Discussed alternative weight loss medications including Zepbound, Saxenda, and Contrave. -Attempt to obtain Zepbound through [place] and [facility]. -If Zepbound is not approved, consider Saxenda or Contrave.      Relevant Medications   tirzepatide (ZEPBOUND) 2.5 MG/0.5ML Pen   tirzepatide (ZEPBOUND) 5 MG/0.5ML Pen      Follow-up in three months to re-evaluate weight and blood pressure control.       Return in about 3 months (around 04/29/2023) for chronic disease f/u.    Shirlee Latch, MD

## 2023-01-28 NOTE — Assessment & Plan Note (Signed)
Patient reports pain in R CMC joint, affecting ability to open jars. -Refer to orthopedics for potential steroid injection.

## 2023-01-28 NOTE — Assessment & Plan Note (Signed)
Well controlled, but patient reports leg swelling potentially related to amlodipine. -Discontinue amlodipine and add metoprolol 25mg  daily. -Continue Olmesartan and HCTZ at current doses.

## 2023-01-28 NOTE — Assessment & Plan Note (Signed)
Insurance denied coverage for [facility] due to recent weight gain, but patient wasn't able to obtain the medication due to backorder. Patient has been on Weight Watchers for a week. Discussed alternative weight loss medications including Zepbound, Saxenda, and Contrave. -Attempt to obtain Zepbound through [place] and [facility]. -If Zepbound is not approved, consider Saxenda or Contrave.

## 2023-01-30 ENCOUNTER — Encounter: Payer: Self-pay | Admitting: Family Medicine

## 2023-01-30 NOTE — Telephone Encounter (Signed)
Received a fax from covermymeds for Zepbound 5mg /0.68ml  Key: [id number]

## 2023-01-31 ENCOUNTER — Telehealth: Payer: Self-pay | Admitting: Family Medicine

## 2023-01-31 NOTE — Telephone Encounter (Signed)
PA for Zepbound Key:[id number]

## 2023-01-31 NOTE — Telephone Encounter (Signed)
Notes send to plan

## 2023-01-31 NOTE — Telephone Encounter (Signed)
Covermymeds is requesting prior authorization Key: [id number] Name: Tonya Robertson Patient unable to start [facility] it has been denied

## 2023-02-03 NOTE — Telephone Encounter (Signed)
Zepbound was approved.

## 2023-02-03 NOTE — Telephone Encounter (Signed)
Outcome Approved today by [facility] 2017 NCPDP Request Reference Number: [id number]. ZEPBOUND INJ 5/0.5ML is approved through 08/03/2023. Your patient may now fill this prescription and it will be covered. Authorization Expiration Date: 08/03/2023 Drug Zepbound 5MG /0.5ML pen-injectors

## 2023-02-12 ENCOUNTER — Other Ambulatory Visit: Payer: Self-pay | Admitting: Internal Medicine

## 2023-02-12 DIAGNOSIS — F3341 Major depressive disorder, recurrent, in partial remission: Secondary | ICD-10-CM

## 2023-02-13 MED ORDER — HYDROXYZINE HCL 50 MG PO TABS: 50.0000 mg | ORAL_TABLET | Freq: Three times a day (TID) | ORAL | 0 refills | Status: DC | PRN

## 2023-04-18 ENCOUNTER — Other Ambulatory Visit: Payer: Self-pay | Admitting: Family Medicine

## 2023-04-18 NOTE — Telephone Encounter (Signed)
Requested Prescriptions  Pending Prescriptions Disp Refills   albuterol (VENTOLIN HFA) 108 (90 Base) MCG/ACT inhaler [Pharmacy Med Name: ALBUTEROL HFA INH (200 PUFFS) 6.7GM] 6.7 g 2    Sig: INHALE 2 PUFFS BY MOUTH EVERY 6 HOURS AS NEEDED FOR WHEEZING     Pulmonology:  Beta Agonists 2 Passed - 04/18/2023  3:53 PM      Passed - Last BP in normal range    BP Readings from Last 1 Encounters:  01/28/23 116/71         Passed - Last Heart Rate in normal range    Pulse Readings from Last 1 Encounters:  01/28/23 84         Passed - Valid encounter within last 12 months    Recent Outpatient Visits           2 months ago Morbid obesity [facility])   [place] [facility] [place], Marzella Schlein, MD   3 months ago Morbid obesity [facility])   [place] [facility] [place], Marzella Schlein, MD   1 year ago Hypertension, unspecified type   [facility] Margarita Mail, [place]

## 2023-04-29 ENCOUNTER — Telehealth: Payer: Managed Care, Other (non HMO) | Admitting: Physician Assistant

## 2023-04-29 DIAGNOSIS — J019 Acute sinusitis, unspecified: Secondary | ICD-10-CM | POA: Diagnosis not present

## 2023-04-29 DIAGNOSIS — B9689 Other specified bacterial agents as the cause of diseases classified elsewhere: Secondary | ICD-10-CM

## 2023-04-29 MED ORDER — AMOXICILLIN-POT CLAVULANATE 875-125 MG PO TABS
1.0000 | ORAL_TABLET | Freq: Two times a day (BID) | ORAL | 0 refills | Status: DC
Start: 2023-04-29 — End: 2023-09-08

## 2023-04-29 NOTE — Progress Notes (Signed)

## 2023-04-29 NOTE — Progress Notes (Signed)
I have spent 5 minutes in review of e-visit questionnaire, review and updating patient chart, medical decision making and response to patient.   Mia Milan Cody Jacklynn Dehaas, PA-C    

## 2023-05-12 ENCOUNTER — Telehealth: Payer: Self-pay | Admitting: Family Medicine

## 2023-05-12 ENCOUNTER — Other Ambulatory Visit: Payer: Self-pay | Admitting: Family Medicine

## 2023-05-12 NOTE — Telephone Encounter (Signed)
Received fax from [facility] wanting refills on hydroxyzine hcl 50 mg #30

## 2023-05-13 ENCOUNTER — Other Ambulatory Visit: Payer: Self-pay

## 2023-05-13 DIAGNOSIS — F3341 Major depressive disorder, recurrent, in partial remission: Secondary | ICD-10-CM

## 2023-05-13 MED ORDER — HYDROXYZINE HCL 50 MG PO TABS
50.0000 mg | ORAL_TABLET | Freq: Three times a day (TID) | ORAL | 0 refills | Status: DC | PRN
Start: 2023-05-13 — End: 2023-09-08

## 2023-05-13 NOTE — Telephone Encounter (Signed)
Rx request sent to provider as requested

## 2023-05-13 NOTE — Telephone Encounter (Signed)
No appt needed. You can send it as a Rx request and I can fill it.

## 2023-06-10 ENCOUNTER — Other Ambulatory Visit: Payer: Self-pay | Admitting: Family Medicine

## 2023-06-10 NOTE — Telephone Encounter (Signed)
 Requested medication (s) are due for refill today- yes  Requested medication (s) are on the active medication list -yes  Future visit scheduled -no  Last refill: 05/13/23 2ml  Notes to clinic: off protocol- provider review , last refill has notes  Requested Prescriptions  Pending Prescriptions Disp Refills   ZEPBOUND  5 MG/0.5ML Pen [Pharmacy Med Name: ZEPBOUND  5MG /0.5ML INJ (4 PF PENS)] 2 mL 0    Sig: ADMINISTER 5 MG UNDER THE SKIN 1 TIME A WEEK. NEED FOLLOW UP APPOINTMENT BEFORE NEXT REFILL     Off-Protocol Failed - 06/10/2023  2:58 PM      Failed - Medication not assigned to a protocol, review manually.      Passed - Valid encounter within last 12 months    Recent Outpatient Visits           4 months ago Morbid obesity [facility])   [place] [facility] [place], Jon HERO, MD   5 months ago Morbid obesity [facility])   [place] [facility] [place], Jon HERO, MD   2 years ago Hypertension, unspecified type   [facility] Bernardo Fend, DO                 Requested Prescriptions  Pending Prescriptions Disp Refills   ZEPBOUND  5 MG/0.5ML Pen [Pharmacy Med Name: ZEPBOUND  5MG /0.5ML INJ (4 PF PENS)] 2 mL 0    Sig: ADMINISTER 5 MG UNDER THE SKIN 1 TIME A WEEK. NEED FOLLOW UP APPOINTMENT BEFORE NEXT REFILL     Off-Protocol Failed - 06/10/2023  2:58 PM      Failed - Medication not assigned to a protocol, review manually.      Passed - Valid encounter within last 12 months    Recent Outpatient Visits           4 months ago Morbid obesity [facility])   [place] [facility] [place], Jon HERO, MD   5 months ago Morbid obesity [facility])   [place] [facility] [place], Jon HERO, MD   2 years ago Hypertension, unspecified type   [facility] Bernardo Fend, [place]

## 2023-07-06 ENCOUNTER — Encounter: Payer: Self-pay | Admitting: Family Medicine

## 2023-07-07 ENCOUNTER — Telehealth: Payer: Self-pay | Admitting: Family Medicine

## 2023-07-07 ENCOUNTER — Other Ambulatory Visit: Payer: Self-pay | Admitting: Family Medicine

## 2023-07-07 NOTE — Telephone Encounter (Signed)
 Requested medication (s) are due for refill today: Yes  Requested medication (s) are on the active medication list: Yes  Last refill:  06/02/23  Future visit scheduled: No  Notes to clinic:  Unable to refill due to no refill protocol for this medication.      Requested Prescriptions  Pending Prescriptions Disp Refills   ZEPBOUND  5 MG/0.5ML Pen [Pharmacy Med Name: ZEPBOUND  5MG /0.5ML INJ (4 PF PENS)] 2 mL 0    Sig: ADMINISTER 5 MG UNDER THE SKIN 1 TIME A WEEK. NEED FOLLOW UP APPOINTMENT BEFORE NEXT REFILL     Off-Protocol Failed - 07/07/2023  1:29 PM      Failed - Medication not assigned to a protocol, review manually.      Passed - Valid encounter within last 12 months    Recent Outpatient Visits           5 months ago Morbid obesity [facility])   [place] [facility] [place], Stan Eans, MD   6 months ago Morbid obesity [facility])   [place] [facility] [place], Stan Eans, MD   2 years ago Hypertension, unspecified type   [facility] Rockney Cid, [place]

## 2023-07-07 NOTE — Telephone Encounter (Signed)
 Received a fax from covermymeds for Zepbound  5mg /0.40ml  Key:  [id number]

## 2023-07-09 ENCOUNTER — Encounter: Payer: Self-pay | Admitting: Family Medicine

## 2023-07-09 ENCOUNTER — Telehealth: Payer: Self-pay

## 2023-07-09 NOTE — Telephone Encounter (Signed)
Pharmacy Patient Advocate Encounter   Received notification from Pt Calls Messages that prior authorization for Zepbound 5MG /0.5ML pen-injectors is required/requested.   Insurance verification completed.   The patient is insured through [facility] .   Per test claim: PA required; PA submitted to above mentioned insurance via CoverMyMeds Key/confirmation #/EOC [id number] Status is pending

## 2023-07-11 NOTE — Telephone Encounter (Signed)
Pharmacy Patient Advocate Encounter  Received notification from [facility] that Prior Authorization for Zepbound 5MG /0.5ML pen-injectors  has been DENIED.  See denial reason below. No denial letter attached in CMM. Will attach denial letter to Media tab once received.   PA #/Case ID/Reference #:  [id number]

## 2023-07-11 NOTE — Telephone Encounter (Signed)
Just let patient know per below

## 2023-07-17 NOTE — Telephone Encounter (Signed)
I bet the problem is that we don't have an updated visit/weight in the chart. Advise her that she needs to be seen in person and we need to get updated weight and documentation and then have PA team resubmit PA.

## 2023-07-25 ENCOUNTER — Telehealth: Payer: Self-pay | Admitting: Family Medicine

## 2023-07-25 NOTE — Telephone Encounter (Signed)
 LOV 01/16/23 but cholesterol was not addressed. NOV 08/01/23. LRF 01/17/23 #90 1RF

## 2023-07-25 NOTE — Telephone Encounter (Signed)
 [facility] faxed refill request for the following medications:   fenofibrate (TRICOR) 48 MG tablet      Please advise.

## 2023-07-28 MED ORDER — FENOFIBRATE 48 MG PO TABS: 48.0000 mg | ORAL_TABLET | Freq: Every day | ORAL | 3 refills | Status: DC

## 2023-08-13 ENCOUNTER — Telehealth: Payer: Self-pay | Admitting: Family Medicine

## 2023-08-13 NOTE — Telephone Encounter (Signed)
 [facility] faxed refill request for the following medications:   fenofibrate (TRICOR) 48 MG tablet      Please advise.

## 2023-08-14 NOTE — Telephone Encounter (Signed)
 Prescription with 90 day supply and 3 refills were sent to [facility] on 07/28/2023.  LMTCB-to verify with patient if she would like prescription to send to [facility]

## 2023-08-15 ENCOUNTER — Other Ambulatory Visit: Payer: Self-pay | Admitting: Family Medicine

## 2023-08-15 NOTE — Telephone Encounter (Signed)
 [facility] pharmacy is requesting refill fenofibrate (TRICOR) 48 MG tablet  Please advise

## 2023-08-18 MED ORDER — FENOFIBRATE 48 MG PO TABS: 48.0000 mg | ORAL_TABLET | Freq: Every day | ORAL | 3 refills | Status: DC

## 2023-09-01 ENCOUNTER — Other Ambulatory Visit: Payer: Self-pay | Admitting: Family Medicine

## 2023-09-03 ENCOUNTER — Encounter: Payer: Self-pay | Admitting: Family Medicine

## 2023-09-03 ENCOUNTER — Telehealth: Payer: Self-pay | Admitting: Family Medicine

## 2023-09-03 NOTE — Telephone Encounter (Signed)
 [facility] fax[facility] refill request for the following medications:   olmesartan-hydrochlorothiazide (BENICAR HCT) 40-25 MG tablet     Please advise.

## 2023-09-04 ENCOUNTER — Other Ambulatory Visit: Payer: Self-pay | Admitting: Family Medicine

## 2023-09-04 DIAGNOSIS — F3341 Major depressive disorder, recurrent, in partial remission: Secondary | ICD-10-CM

## 2023-09-04 MED ORDER — OLMESARTAN MEDOXOMIL-HCTZ 40-25 MG PO TABS: 1.0000 | ORAL_TABLET | Freq: Every day | ORAL | 0 refills | Status: DC

## 2023-09-04 NOTE — Telephone Encounter (Signed)
 Received a fax from [facility]  stating must be 90 day supply in order for insurance to pay.  Please advise.

## 2023-09-04 NOTE — Telephone Encounter (Signed)
 She needs appt. Was supposed to f/u in 12/24 to reassess zepbound

## 2023-09-04 NOTE — Addendum Note (Signed)
 Addended by: Lily Kocher on: 09/04/2023 02:35 PM   Modules accepted: Orders

## 2023-09-05 NOTE — Telephone Encounter (Signed)
 Agree that she needs an appt. We already gave a courtesy refill

## 2023-09-05 NOTE — Telephone Encounter (Signed)
 90 day rx declined.  60 day courtesy sent Pt needs to be seen for further refills per Dr. Leonard Schwartz

## 2023-09-05 NOTE — Telephone Encounter (Signed)
 Unable to refill per protocol, appointment needed.   Requested Prescriptions  Pending Prescriptions Disp Refills   hydrOXYzine (ATARAX) 50 MG tablet [Pharmacy Med Name: HYDROXYZINE HCL 50MG  TABS (WHITE)] 30 tablet 0    Sig: TAKE 1 TABLET BY MOUTH EVERY 8 HOURS AS NEEDED FOR ANXIETY     Ear, Nose, and Throat:  Antihistamines 2 Failed - 09/05/2023 10:16 AM      Failed - Cr in normal range and within 360 days    Creatinine, Ser  Date Value Ref Range Status  08/17/2019 0.87 0.57 - 1.00 mg/dL Final         Failed - Valid encounter within last 12 months    Recent Outpatient Visits   None

## 2023-09-08 ENCOUNTER — Telehealth: Payer: Self-pay

## 2023-09-08 ENCOUNTER — Encounter: Payer: Self-pay | Admitting: Family Medicine

## 2023-09-08 ENCOUNTER — Ambulatory Visit: Admitting: Family Medicine

## 2023-09-08 ENCOUNTER — Other Ambulatory Visit (HOSPITAL_COMMUNITY): Payer: Self-pay

## 2023-09-08 VITALS — BP 122/83 | HR 83 | Wt 195.8 lb

## 2023-09-08 DIAGNOSIS — Z23 Encounter for immunization: Secondary | ICD-10-CM

## 2023-09-08 DIAGNOSIS — Z6833 Body mass index (BMI) 33.0-33.9, adult: Secondary | ICD-10-CM

## 2023-09-08 DIAGNOSIS — F3341 Major depressive disorder, recurrent, in partial remission: Secondary | ICD-10-CM

## 2023-09-08 DIAGNOSIS — I10 Essential (primary) hypertension: Secondary | ICD-10-CM

## 2023-09-08 DIAGNOSIS — F411 Generalized anxiety disorder: Secondary | ICD-10-CM

## 2023-09-08 DIAGNOSIS — E782 Mixed hyperlipidemia: Secondary | ICD-10-CM | POA: Diagnosis not present

## 2023-09-08 MED ORDER — BUPROPION HCL ER (XL) 300 MG PO TB24: 300.0000 mg | ORAL_TABLET | Freq: Every day | ORAL | 3 refills | Status: AC

## 2023-09-08 MED ORDER — HYDROXYZINE HCL 50 MG PO TABS: 50.0000 mg | ORAL_TABLET | Freq: Three times a day (TID) | ORAL | 1 refills | Status: DC | PRN

## 2023-09-08 MED ORDER — OLMESARTAN MEDOXOMIL-HCTZ 40-25 MG PO TABS: 1.0000 | ORAL_TABLET | Freq: Every day | ORAL | 1 refills | Status: AC

## 2023-09-08 MED ORDER — FENOFIBRATE 48 MG PO TABS: 48.0000 mg | ORAL_TABLET | Freq: Every day | ORAL | 3 refills | Status: DC

## 2023-09-08 MED ORDER — ZEPBOUND 7.5 MG/0.5ML ~~LOC~~ SOAJ: 7.5000 mg | SUBCUTANEOUS | 0 refills | Status: DC

## 2023-09-08 MED ORDER — DULOXETINE HCL 60 MG PO CPEP
60.0000 mg | ORAL_CAPSULE | Freq: Every day | ORAL | 1 refills | Status: AC
Start: 2023-09-08 — End: ?

## 2023-09-08 MED ORDER — METOPROLOL SUCCINATE ER 25 MG PO TB24: 25.0000 mg | ORAL_TABLET | Freq: Every day | ORAL | 3 refills | Status: AC

## 2023-09-08 NOTE — Telephone Encounter (Signed)
 Pharmacy Patient Advocate Encounter   Received notification from Onbase that prior authorization for Zepbound 7.5MG /0.5ML pen-injectors is required/requested.   Insurance verification completed.   The patient is insured through [facility] .   Per test claim: PA required; PA submitted to above mentioned insurance via CoverMyMeds Key/confirmation #/EOC [id number] Status is pending

## 2023-09-08 NOTE — Assessment & Plan Note (Signed)
 She reports increased anxiety related to potential job change and relocation, with symptoms including nervousness, picking at skin, and feeling on the verge of a panic attack. Currently taking hydroxyzine during the day for anxiety relief. Cymbalta dosage is 40 mg daily, which helps with mood and anxiety but may need adjustment for better control. Hydroxyzine is not habit-forming and can cause drowsiness, but she reports it levels her anxiety without causing sleepiness. - Increase Cymbalta to 60 mg daily - Continue hydroxyzine as needed for anxiety

## 2023-09-08 NOTE — Progress Notes (Signed)
 Established patient visit   Patient: Tonya Robertson   DOB: [birth year]   58 y.o. Female  MRN: [id number] Visit Date: 09/08/2023  Today's healthcare provider: Shirlee Latch, MD   Chief Complaint  Patient presents with   Medical Management of Chronic Issues   Depression   Hypertension    Patient does not monitor at home. Reports palpitations but aware it is due to anxiety and no other symptoms to report   Obesity    Pt is wanting to titrate up to her next dose of zepbound   Medication Refill    Needing all medications refilled   Care Management    Cervical Cancer Screening - partial hysterectomy, has not had since 2008 Mammogram- last year Tetanus Vaccine - declined Pneumococcal Vaccine - declined Zoster Vaccine - yes   Subjective    Depression       Hypertension  Medication Refill   HPI     Hypertension    Additional comments: Patient does not monitor at home. Reports palpitations but aware it is due to anxiety and no other symptoms to report        Obesity    Additional comments: Pt is wanting to titrate up to her next dose of zepbound        Medication Refill    Additional comments: Needing all medications refilled        Care Management    Additional comments: Cervical Cancer Screening - partial hysterectomy, has not had since 2008 Mammogram- last year Tetanus Vaccine - declined Pneumococcal Vaccine - declined Zoster Vaccine - yes      Last edited by Acey Lav, CMA on 09/08/2023 10:20 AM.       Discussed the use of AI scribe software for clinical note transcription with the patient, who gave verbal consent to proceed.  History of Present Illness   A 58 year old patient with a history of hypertension, morbid obesity, hyperlipidemia, and anxiety presents for a chronic follow-up. The patient is currently on multiple medications including Wellbutrin XL 300mg  daily, Cymbalta 20mg  daily, fenofibrate, metoprolol XL 25mg  daily, HCTZ 40-25mg   daily, and Zepbound 5mg  weekly. The patient expresses a desire to increase the dose of Zepbound to aid in weight loss. The patient reports slow weight loss, with fluctuations in weight but overall maintaining a weight between 190 and 200 pounds.  In addition to the weight concerns, the patient is experiencing heightened anxiety due to potential life changes. The patient is considering a job promotion and a move, which has led to increased nervousness and physical manifestations of anxiety such as skin picking. The patient reports using hydroxyzine, a medication prescribed for sleep, during the day to help manage the anxiety. The patient denies feeling sad but reports feeling scared due to the uncertainty of the future.           09/08/2023   10:21 AM 01/28/2023    1:19 PM  GAD 7 : Generalized Anxiety Score  Nervous, Anxious, on Edge 2 0  Control/stop worrying 2 0  Worry too much - different things 2 0  Trouble relaxing 2 0  Restless 2 0  Easily annoyed or irritable 0 0  Afraid - awful might happen 0 0  Total GAD 7 Score 10 0  Anxiety Difficulty  Not difficult at all       09/08/2023   10:21 AM 01/28/2023    1:19 PM 01/06/2023    1:29 PM 05/22/2021    1:00 PM 12/30/2019  8:25 AM  Depression screen PHQ 2/9  Decreased Interest 0 0 0 0 0  Down, Depressed, Hopeless 0 0 0 0 0  PHQ - 2 Score 0 0 0 0 0  Altered sleeping 0 1 0 0   Tired, decreased energy 0 0 0 0   Change in appetite 0 0 0 0   Feeling bad or failure about yourself  0 0 0 0   Trouble concentrating 0 0 0 0   Moving slowly or fidgety/restless 0 0 0 0   Suicidal thoughts 0 0 0 0   PHQ-9 Score 0 1 0 0   Difficult doing work/chores Not difficult at all Not difficult at all  Not difficult at all     Medications: Outpatient Medications Prior to Visit  Medication Sig   albuterol (VENTOLIN HFA) 108 (90 Base) MCG/ACT inhaler INHALE 2 PUFFS BY MOUTH EVERY 6 HOURS AS NEEDED FOR WHEEZING   [DISCONTINUED] buPROPion (WELLBUTRIN XL)  300 MG 24 hr tablet Take 1 tablet (300 mg total) by mouth daily.   [DISCONTINUED] DULoxetine (CYMBALTA) 20 MG capsule Take 1 capsule (20 mg total) by mouth daily. (Patient taking differently: Take 60 mg by mouth daily.)   [DISCONTINUED] fenofibrate (TRICOR) 48 MG tablet Take 1 tablet (48 mg total) by mouth daily.   [DISCONTINUED] hydrOXYzine (ATARAX) 50 MG tablet Take 1 tablet (50 mg total) by mouth every 8 (eight) hours as needed for anxiety. hydroxyzine HCl 50 mg tablet, take as needed for anxiety.   [DISCONTINUED] metoprolol succinate (TOPROL-XL) 25 MG 24 hr tablet Take 1 tablet (25 mg total) by mouth daily.   [DISCONTINUED] olmesartan-hydrochlorothiazide (BENICAR HCT) 40-25 MG tablet Take 1 tablet by mouth daily.   [DISCONTINUED] ZEPBOUND 5 MG/0.5ML Pen ADMINISTER 5 MG UNDER THE SKIN 1 TIME A WEEK. NEED FOLLOW UP APPOINTMENT BEFORE NEXT REFILL   [DISCONTINUED] amoxicillin-clavulanate (AUGMENTIN) 875-125 MG tablet Take 1 tablet by mouth 2 (two) times daily.   [DISCONTINUED] colchicine 0.6 MG tablet Take 0.6 mg by mouth as needed. (Patient not taking: Reported on 09/08/2023)   No facility-administered medications prior to visit.    Review of Systems  Psychiatric/Behavioral:  Positive for depression.        Objective    BP 122/83 (BP Location: Left Arm, Patient Position: Sitting, Cuff Size: Normal)   Pulse 83   Wt 195 lb 12.8 oz (88.8 kg)   BMI 33.61 kg/m    Physical Exam Vitals reviewed.  Constitutional:      General: She is not in acute distress.    Appearance: Normal appearance. She is well-developed. She is not diaphoretic.  HENT:     Head: Normocephalic and atraumatic.  Eyes:     General: No scleral icterus.    Conjunctiva/sclera: Conjunctivae normal.  Neck:     Thyroid: No thyromegaly.  Cardiovascular:     Rate and Rhythm: Normal rate and regular rhythm.     Heart sounds: Normal heart sounds. No murmur heard. Pulmonary:     Effort: Pulmonary effort is normal. No  respiratory distress.     Breath sounds: Normal breath sounds. No wheezing, rhonchi or rales.  Musculoskeletal:     Cervical back: Neck supple.     Right lower leg: No edema.     Left lower leg: No edema.  Lymphadenopathy:     Cervical: No cervical adenopathy.  Skin:    General: Skin is warm and dry.     Findings: No rash.  Neurological:     Mental  Status: She is alert and oriented to person, place, and time. Mental status is at baseline.  Psychiatric:        Mood and Affect: Mood normal.        Behavior: Behavior normal.      Results for orders placed or performed in visit on 09/08/23  HM MAMMOGRAPHY  Result Value Ref Range   HM Mammogram 0-4 Bi-Rad 0-4 Bi-Rad, Self Reported Normal    Assessment & Plan     Problem List Items Addressed This Visit       Cardiovascular and Mediastinum   Hypertensive disorder   Blood pressure is well-controlled on current medication regimen, including metoprolol XL and HCTZ. Continue      Relevant Medications   fenofibrate (TRICOR) 48 MG tablet   metoprolol succinate (TOPROL-XL) 25 MG 24 hr tablet   olmesartan-hydrochlorothiazide (BENICAR HCT) 40-25 MG tablet     Other   Mixed hyperlipidemia   Cholesterol and triglycerides have improved with fenofibrate. Recent labs from September 2024 show cholesterol levels are high but improved and nearing normal. A1c is normal, indicating no issues with blood sugar levels. - She will send recent lab results through MyChart      Relevant Medications   fenofibrate (TRICOR) 48 MG tablet   metoprolol succinate (TOPROL-XL) 25 MG 24 hr tablet   olmesartan-hydrochlorothiazide (BENICAR HCT) 40-25 MG tablet   Morbid obesity (HCC)   She has been on Zepbound 5 mg weekly for weight management with slow progress, fluctuating between 190 and 200 pounds, unable to achieve weight loss below 190 pounds. Decision made to increase Zepbound dosage to 7.5 mg weekly to enhance weight loss efforts. Potential to increase  dosage up to 15 mg weekly if needed, with follow-up in three months to assess progress and ensure insurance coverage. Insurance requires documented follow-up on weight to continue coverage. - Increase Zepbound to 7.5 mg weekly - Schedule follow-up in three months to assess weight loss progress      Relevant Medications   tirzepatide (ZEPBOUND) 7.5 MG/0.5ML Pen   GAD (generalized anxiety disorder)   She reports increased anxiety related to potential job change and relocation, with symptoms including nervousness, picking at skin, and feeling on the verge of a panic attack. Currently taking hydroxyzine during the day for anxiety relief. Cymbalta dosage is 40 mg daily, which helps with mood and anxiety but may need adjustment for better control. Hydroxyzine is not habit-forming and can cause drowsiness, but she reports it levels her anxiety without causing sleepiness. - Increase Cymbalta to 60 mg daily - Continue hydroxyzine as needed for anxiety      Relevant Medications   buPROPion (WELLBUTRIN XL) 300 MG 24 hr tablet   DULoxetine (CYMBALTA) 60 MG capsule   hydrOXYzine (ATARAX) 50 MG tablet   Recurrent major depressive disorder, in partial remission (HCC) - Primary   Relevant Medications   buPROPion (WELLBUTRIN XL) 300 MG 24 hr tablet   DULoxetine (CYMBALTA) 60 MG capsule   hydrOXYzine (ATARAX) 50 MG tablet   Other Visit Diagnoses       Need for shingles vaccine       Relevant Orders   Varicella-zoster vaccine IM (Completed)     Immunization due       Relevant Orders   Varicella-zoster vaccine IM (Completed)          General Health Maintenance She is due for shingles vaccination. First dose to be administered today with a second dose scheduled in two to six months. Discussed  potential side effects of the shingles vaccine, including redness and swelling at the injection site, and advised that these are not allergic reactions. Ice can help reduce swelling if it occurs. - Administer  first dose of shingles vaccine today - Schedule second dose of shingles vaccine in two to six months       Return in about 3 months (around 12/08/2023) for chronic disease f/u.       Aden Agreste, MD  [facility] [phone] (phone) [phone] (fax)  [facility]

## 2023-09-08 NOTE — Telephone Encounter (Signed)
 Pharmacy Patient Advocate Encounter  Received notification from [facility] that Prior Authorization for Zepbound 7.5MG /0.5ML pen-injectors has been APPROVED from 09/08/23 to 03/09/24   PA #/Case ID/Reference #: [id number]

## 2023-09-08 NOTE — Assessment & Plan Note (Signed)
 Cholesterol and triglycerides have improved with fenofibrate. Recent labs from September 2024 show cholesterol levels are high but improved and nearing normal. A1c is normal, indicating no issues with blood sugar levels. - She will send recent lab results through MyChart

## 2023-09-08 NOTE — Assessment & Plan Note (Signed)
 She has been on Zepbound 5 mg weekly for weight management with slow progress, fluctuating between 190 and 200 pounds, unable to achieve weight loss below 190 pounds. Decision made to increase Zepbound dosage to 7.5 mg weekly to enhance weight loss efforts. Potential to increase dosage up to 15 mg weekly if needed, with follow-up in three months to assess progress and ensure insurance coverage. Insurance requires documented follow-up on weight to continue coverage. - Increase Zepbound to 7.5 mg weekly - Schedule follow-up in three months to assess weight loss progress

## 2023-09-08 NOTE — Assessment & Plan Note (Signed)
 Blood pressure is well-controlled on current medication regimen, including metoprolol XL and HCTZ. Continue

## 2023-12-26 LAB — LAB REPORT - SCANNED: A1c: 4.9

## 2023-12-29 ENCOUNTER — Other Ambulatory Visit: Payer: Self-pay

## 2023-12-29 ENCOUNTER — Telehealth: Payer: Self-pay | Admitting: Family Medicine

## 2023-12-29 MED ORDER — ZEPBOUND 7.5 MG/0.5ML ~~LOC~~ SOAJ: 7.5000 mg | SUBCUTANEOUS | 0 refills | Status: DC

## 2023-12-29 NOTE — Telephone Encounter (Signed)
Converted to refill request 

## 2023-12-29 NOTE — Telephone Encounter (Signed)
 [facility] faxed refill request for the following medications:   tirzepatide  (ZEPBOUND ) 7.5 MG/0.5ML Pen    Please advise.

## 2024-02-11 ENCOUNTER — Other Ambulatory Visit: Payer: Self-pay

## 2024-02-11 ENCOUNTER — Telehealth: Payer: Self-pay | Admitting: Family Medicine

## 2024-02-11 MED ORDER — ALBUTEROL SULFATE HFA 108 (90 BASE) MCG/ACT IN AERS: 2.0000 | INHALATION_SPRAY | RESPIRATORY_TRACT | 2 refills | Status: AC | PRN

## 2024-02-11 NOTE — Telephone Encounter (Signed)
 [facility] @ [address]  is asking for refills on Albuterol  HFA Inh (200 puff) 6.7 GM with refills

## 2024-02-12 ENCOUNTER — Other Ambulatory Visit (HOSPITAL_COMMUNITY): Payer: Self-pay

## 2024-02-12 ENCOUNTER — Telehealth: Payer: Self-pay

## 2024-02-12 NOTE — Telephone Encounter (Signed)
 Pharmacy Patient Advocate Encounter   Received notification from Onbase that prior authorization for Zepbound  7.5MG /0.5ML pen-injectors is required/requested.   Insurance verification completed.   The patient is insured through [facility] .   Per test claim: PA required; PA started via CoverMyMeds. KEY [id number] . Waiting for clinical questions to populate.

## 2024-02-13 NOTE — Telephone Encounter (Signed)
Thank you, PA has been submitted

## 2024-02-13 NOTE — Telephone Encounter (Signed)
 Pharmacy Patient Advocate Encounter  Received notification from [employer] that Prior Authorization for Zepbound  has been APPROVED from 09/019/2025 to 02/12/2025   PA #/Case ID/Reference #: [id number]

## 2024-02-16 ENCOUNTER — Encounter: Payer: Self-pay | Admitting: Family Medicine

## 2024-02-17 NOTE — Telephone Encounter (Signed)
 Per pt pharmacy has got it covered.

## 2024-03-17 ENCOUNTER — Encounter: Payer: Self-pay | Admitting: Family Medicine

## 2024-03-18 ENCOUNTER — Encounter: Payer: Self-pay | Admitting: Family Medicine

## 2024-03-22 MED ORDER — FENOFIBRATE 160 MG PO TABS: 160.0000 mg | ORAL_TABLET | Freq: Every day | ORAL | 1 refills | Status: AC

## 2024-03-22 NOTE — Telephone Encounter (Signed)
 Duplicate message

## 2024-03-31 ENCOUNTER — Telehealth: Payer: Self-pay | Admitting: Family Medicine

## 2024-03-31 NOTE — Telephone Encounter (Signed)
 [facility] faxed refill request for the following medications:   tirzepatide  (ZEPBOUND ) 7.5 MG/0.5ML    Please advise.

## 2024-04-01 ENCOUNTER — Encounter: Payer: Self-pay | Admitting: Family Medicine

## 2024-04-06 MED ORDER — ZEPBOUND 7.5 MG/0.5ML ~~LOC~~ SOAJ: 7.5000 mg | SUBCUTANEOUS | 0 refills | Status: AC

## 2024-04-27 ENCOUNTER — Other Ambulatory Visit: Payer: Self-pay

## 2024-04-27 ENCOUNTER — Telehealth: Payer: Self-pay | Admitting: Family Medicine

## 2024-04-27 DIAGNOSIS — F3341 Major depressive disorder, recurrent, in partial remission: Secondary | ICD-10-CM

## 2024-04-27 MED ORDER — HYDROXYZINE HCL 50 MG PO TABS: 50.0000 mg | ORAL_TABLET | Freq: Three times a day (TID) | ORAL | 1 refills | Status: AC | PRN

## 2024-04-27 NOTE — Telephone Encounter (Signed)
 Converted into a refill request

## 2024-04-27 NOTE — Telephone Encounter (Signed)
 [facility] faxed refill request for the following medications:   hydrOXYzine  (ATARAX ) 50 MG tablet    Please advise.

## 2024-04-29 ENCOUNTER — Encounter

## 2024-05-10 ENCOUNTER — Encounter: Payer: Self-pay | Admitting: Family Medicine

## 2024-06-04 ENCOUNTER — Encounter: Payer: Self-pay | Admitting: Family Medicine

## 2024-08-24 ENCOUNTER — Ambulatory Visit: Admitting: Family Medicine
